# Patient Record
Sex: Female | Born: 1980 | Race: Black or African American | Hispanic: No | Marital: Single | Smoking: Current every day smoker
Health system: Southern US, Community
[De-identification: ages and names within clinical notes are randomized; demographics above are authoritative.]

## PROBLEM LIST (undated history)

## (undated) DIAGNOSIS — Z862 Personal history of diseases of the blood and blood-forming organs and certain disorders involving the immune mechanism: Secondary | ICD-10-CM

## (undated) DIAGNOSIS — Z872 Personal history of diseases of the skin and subcutaneous tissue: Secondary | ICD-10-CM

## (undated) DIAGNOSIS — L732 Hidradenitis suppurativa: Secondary | ICD-10-CM

## (undated) HISTORY — DX: Hidradenitis suppurativa: L73.2

## (undated) HISTORY — DX: Personal history of diseases of the blood and blood-forming organs and certain disorders involving the immune mechanism: Z86.2

---

## 1999-10-21 ENCOUNTER — Emergency Department (HOSPITAL_COMMUNITY): Admission: EM | Admit: 1999-10-21 | Discharge: 1999-10-21 | Payer: Self-pay | Admitting: Emergency Medicine

## 2001-04-06 ENCOUNTER — Other Ambulatory Visit: Admission: RE | Admit: 2001-04-06 | Discharge: 2001-04-06 | Payer: Self-pay | Admitting: Obstetrics and Gynecology

## 2001-07-20 ENCOUNTER — Encounter (INDEPENDENT_AMBULATORY_CARE_PROVIDER_SITE_OTHER): Payer: Self-pay | Admitting: Specialist

## 2001-07-20 ENCOUNTER — Inpatient Hospital Stay (HOSPITAL_COMMUNITY): Admission: AD | Admit: 2001-07-20 | Discharge: 2001-07-26 | Payer: Self-pay | Admitting: *Deleted

## 2001-07-21 ENCOUNTER — Encounter: Payer: Self-pay | Admitting: Obstetrics & Gynecology

## 2001-10-28 ENCOUNTER — Emergency Department (HOSPITAL_COMMUNITY): Admission: EM | Admit: 2001-10-28 | Discharge: 2001-10-29 | Payer: Self-pay | Admitting: Emergency Medicine

## 2001-10-29 ENCOUNTER — Encounter: Payer: Self-pay | Admitting: Emergency Medicine

## 2003-09-12 ENCOUNTER — Emergency Department (HOSPITAL_COMMUNITY): Admission: EM | Admit: 2003-09-12 | Discharge: 2003-09-12 | Payer: Self-pay | Admitting: Emergency Medicine

## 2005-04-08 ENCOUNTER — Emergency Department (HOSPITAL_COMMUNITY): Admission: EM | Admit: 2005-04-08 | Discharge: 2005-04-08 | Payer: Self-pay | Admitting: Emergency Medicine

## 2010-06-07 ENCOUNTER — Emergency Department (HOSPITAL_COMMUNITY): Admission: EM | Admit: 2010-06-07 | Discharge: 2010-06-07 | Payer: Self-pay | Admitting: Emergency Medicine

## 2010-12-27 NOTE — Discharge Summary (Signed)
[facility]  Patient:    Nichole Robinson, Nichole Robinson Visit Number: [id number] MRN: [id number]          Service Type: OBS Location: 910A 9117 [id number] Attending Physician:  Mickle Mallory Dictated by:   Caralyn Guile Arlyce Dice, M.D. Admit Date:  07/20/2001 Discharge Date: 07/26/2001                             Discharge Summary  FINAL DIAGNOSIS:              Preterm labor, breech presentation.  SECONDARY DIAGNOSES:          None.  PROCEDURE:                    Primary low transverse cesarean section.  CONDITION ON DISCHARGE:       Improved.  HISTORY:                      This is a 30 year old, Gravida 3, Para 0, AB 2 who presented with preterm labor at [redacted] weeks gestation.  Cervix was dilated to 3 cm.  HOSPITAL COURSE:              The patient was admitted to the hospital on July 22, 2001 she was placed on magnesium, given betamethasone and placed at bedrest.  She was followed on this regimen until July 23, 2001 when she was noted to be having more contractions.  At 7 p.m. on July 23, 2001 she was evaluated by Dr. Aldona Bar where the patient was found to be in breech presentation and a foot was palpable in the cervix.  Decision was made at that point to proceed with cesarean section for delivery.  She was brought to the operating room by Dr. Annamaria Helling on July 23, 2001 at 8 p.m. where a 2 pound 9 ounce female infant, Apgars score 6 and 7 was delivered.  The patients postpartum course was benign without significant fever or anemia. On the third postoperative day, the patient was felt to be ready for discharge.  Her hemoglobin was actually quite low at 6.6 gm.  Her white count was 8900. She was discharged on a regular diet and told to limit her activity.  She was given prenatal vitamins, ferrous sulfate, Tylox and over the counter pain medication to take.  She is also asked to follow up in the office in four to six weeks.  LABORATORY DATA:               Revealed an admission hemoglobin of 9.8. Discharge hemoglobin was 6.6.  Her blood chemistries were all within normal limits.  A urinalysis was positive for leukocyte esterase, negative for nitrates and less than 3 WBCs per high powered field.  Her Group B Strep was positive.  RPR was nonreactive. Dictated by:   Caralyn Guile Arlyce Dice, M.D. Attending Physician:  Mickle Mallory DD:  08/17/01 TD:  08/17/01 Job: [id number]

## 2010-12-27 NOTE — Op Note (Signed)
[facility]  Patient:    Nichole Robinson, Nichole Robinson Visit Number: [id number] MRN: [id number]          Service Type: OBS Location: 910B 9164 01 Attending Physician:  Mickle Mallory Dictated by:   Gerrit Friends. Aldona Bar, M.D. Proc. Date: 07/23/01 Admit Date:  07/20/2001                             Operative Report  PREOPERATIVE DIAGNOSES:       A 28 week pregnancy, preterm labor, double footling breech presentation, positive group B Strep.  POSTOPERATIVE DIAGNOSES:      A 28 week pregnancy, preterm labor, double footling breech presentation, positive group B Strep, delivery of viable female infant with Apgars 6 and 7, weight to be determined.  PROCEDURE:                    Primary low transverse cesarean section.  SURGEON:                      Gerrit Friends. Aldona Bar, M.D.  ANESTHESIA:                   Failed spinal, general endotracheal by Dr. Harvest Forest.  HISTORY:                      This 30 year old gravida 3, para 0 was admitted at [redacted] weeks gestation with preterm labor and a cervix dilated 3 cm.  She was begun on Unasyn, magnesium sulfate, given betamethasone, and put at bed rest. She did relatively well.  On the evening of December 13 she noted feeling pressure vaginally and gentle examination revealed one foot protruding through the cervix.  Membranes were still intact.  On ultrasound she was still obviously breech.  Very minimal cervix could be felt.  She was taken to the operating room for a STAT cesarean section at this time.  PROCEDURE:                    Patient was taken to the operating room and a spinal anesthetic was attempted, but unsuccessful.  Thereafter, she was prepped and draped in the usual fashion having been placed in the supine position slight tilt to the left with a Foley catheter inserted and then underwent general endotracheal anesthesia/intubation, etcetera.  Once the patient was well anesthetized the procedure was begun.   A Pfannenstiel incision was made with minimal difficulty, dissected down sharply to and through the fascia which was incised in a low transverse fashion as well.  Subfascial space was created inferiorly and superiorly.  Muscles separated in the midline.  Peritoneum identified and entered appropriately. Care taken to avoid the bowel superiorly and the bladder inferiorly. Vesicouterine peritoneum was identified, opened transversely, and pushed off the lower uterine segment with ease.  Sharp incision of the uterus in the low transverse fashion was then carried out with the Metzenbaum scissors and extended laterally.  At this time the placenta was encountered.  Nonetheless, it was very easy to grab both feet and deliver the baby as a double footling breech extraction using modified lovesett maneuver.  Once the infant was delivered the cord was clamped and cut and the infant was passed off to the awaiting team.  Apgars were assigned at 6 and 7.  The infant was taken to the newborn intensive care unit.  The infant was female.  Weight to be determined.  At this time the cord bloods were collected.  The placenta was delivered intact and the placenta was sent to pathology.  The uterus was then exteriorized and manually inspected and rendered free of any remaining products of conception.  Closure of the uterus incision was then carried out using #1 Vicryl in a running locking fashion.  Good uterine contractility was afforded with slowly given intravenous Pitocin and manual stimulation.  Sever figure-of-eight #1 Vicryl sutures were applied to the uterine incision for additional hemostasis.  Both tubes and ovaries appeared normal as did the uterus itself.  At this time with the uterine incision dry, the cul-de-sac was rendered hemostatic, all free blood and clot were removed, and the uterus was replaced into the abdomen.  Closure of the abdomen at this time was carried out in layers after a careful  count revealed that all counts were correct and no foreign bodies were noted to be remaining in the abdominal cavity.  The abdomen was irrigated profusely, the incision reinspected and noted to be dry. At this time closure of the anterior peritoneum was carried out using 0 Vicryl in a running fashion.  Muscles secured with same.  Assured of good subfascial hemostasis, the fascia was then reapproximated using 0 Vicryl from angle to midline bilaterally.  Subcutaneous tissue was then rendered hemostatic and staples were then used to close the skin.  A sterile pressure dressing was applied and the patient at this time was transported to the recovery room in satisfactory condition having tolerated procedure well.  Estimated blood loss 500 cc.  All counts correct x 2.  In summary, this gravida 3, para 0 was admitted at 28 weeks approximately 72 hours ago in preterm labor where the cervix was at least 3 cm dilated.  She was given Unasyn, magnesium sulfate, and betamethasone, and was stable for approximately 72 hours.  On the evening of December 13 she was noted to have a bulging bag in the vagina and was still breech and one foot of the fetus was protruding through the cervical os.  She was taken to the operating room for cesarean section for delivery.  At the conclusion of the procedure both mother and baby were doing well in their respective recovery areas. Dictated by:   Gerrit Friends. Aldona Bar, M.D. Attending Physician:  Mickle Mallory DD:  07/23/01 TD:  07/23/01 Job: [id number]

## 2011-05-25 ENCOUNTER — Emergency Department (HOSPITAL_COMMUNITY)
Admission: EM | Admit: 2011-05-25 | Discharge: 2011-05-25 | Disposition: A | Discharge status: Home / Self Care | Payer: Self-pay | Attending: Emergency Medicine | Admitting: Emergency Medicine

## 2011-05-25 ENCOUNTER — Emergency Department (HOSPITAL_COMMUNITY): Payer: Self-pay

## 2011-05-25 DIAGNOSIS — M25569 Pain in unspecified knee: Secondary | ICD-10-CM | POA: Insufficient documentation

## 2011-05-25 DIAGNOSIS — M7989 Other specified soft tissue disorders: Secondary | ICD-10-CM | POA: Insufficient documentation

## 2012-08-21 ENCOUNTER — Emergency Department (HOSPITAL_COMMUNITY)
Admission: EM | Admit: 2012-08-21 | Discharge: 2012-08-21 | Disposition: A | Discharge status: Home / Self Care | Payer: Medicaid Other | Source: Home / Self Care

## 2012-08-21 ENCOUNTER — Encounter (HOSPITAL_COMMUNITY): Payer: Self-pay | Admitting: Emergency Medicine

## 2012-08-21 DIAGNOSIS — S93409A Sprain of unspecified ligament of unspecified ankle, initial encounter: Secondary | ICD-10-CM

## 2012-08-21 DIAGNOSIS — L700 Acne vulgaris: Secondary | ICD-10-CM

## 2012-08-21 DIAGNOSIS — L708 Other acne: Secondary | ICD-10-CM

## 2012-08-21 DIAGNOSIS — S96919A Strain of unspecified muscle and tendon at ankle and foot level, unspecified foot, initial encounter: Secondary | ICD-10-CM

## 2012-08-21 MED ORDER — TETRACYCLINE HCL 250 MG PO CAPS: 250.0000 mg | ORAL_CAPSULE | Freq: Four times a day (QID) | ORAL | Status: DC

## 2012-08-21 MED ORDER — CLINDAMYCIN PHOS-BENZOYL PEROX 1-5 % EX GEL: Freq: Two times a day (BID) | CUTANEOUS | Status: AC

## 2012-08-21 NOTE — ED Provider Notes (Signed)
History     CSN: [id number]  Arrival date & time 08/21/12  1145   None     Chief Complaint  Patient presents with  . Ankle Pain  . Rash    (Consider location/radiation/quality/duration/timing/severity/associated sxs/prior treatment) HPI Comments: 32 year old female presents with 2 complaints. 1. Complains of left ankle discomfort for approximately one week. She denies any history of trauma or known injury. She states there is swelling and tenderness primarily to the medial aspect that gets worse at the end of the day and at night. Occasionally feels numbness in the area. Denies injury, pain or discomfort to the foot. She is able to bear weight and ambulate.  2. complaints of facial acne for several years. She has been treated with antibiotics and topical applications as well. In the past several days she has developed pustular lesions on her face. She does not have any medications at this time for treatment. She denies fever or other constitutional symptoms.   History reviewed. No pertinent past medical history.  Past Surgical History  Procedure Date  . Cesarean section     No family history on file.  History  Substance Use Topics  . Smoking status: Current Every Day Smoker -- 0.5 packs/day    Types: Cigarettes  . Smokeless tobacco: Not on file  . Alcohol Use: Yes    OB History    Grav Para Term Preterm Abortions TAB SAB Ect Mult Living                  Review of Systems  Constitutional: Negative.   HENT: Negative.   Respiratory: Negative.   Cardiovascular: Negative.   Musculoskeletal:       As per history of present illness  Skin:       As per history of present illness  All other systems reviewed and are negative.    Allergies  Review of patient's allergies indicates no known allergies.  Home Medications   Current Outpatient Rx  Name  Route  Sig  Dispense  Refill  . MINOCYCLINE HCL PO   Oral   Take by mouth.         Marland Kitchen CLINDAMYCIN PHOS-BENZOYL  PEROX 1-5 % EX GEL   Topical   Apply topically 2 (two) times daily. Till symptoms resolve   25 g   2   . TETRACYCLINE HCL 250 MG PO CAPS   Oral   Take 1 capsule (250 mg total) by mouth 4 (four) times daily.   84 capsule   0     BP 133/89  Pulse 72  Temp 98.7 F (37.1 C) (Oral)  Resp 18  SpO2 100%  LMP 08/07/2012  Physical Exam  Constitutional: She is oriented to person, place, and time. She appears well-developed and well-nourished. No distress.  HENT:  Head: Normocephalic and atraumatic.  Eyes: EOM are normal. Pupils are equal, round, and reactive to light.  Neck: Normal range of motion. Neck supple.  Pulmonary/Chest: Effort normal.  Lymphadenopathy:    She has no cervical adenopathy.  Neurological: She is alert and oriented to person, place, and time. No cranial nerve deficit.  Skin: Skin is warm and dry.       There are several blackheads and small, Doan's to her face. She points to larger pustular lesions on the chin and right side of the nose. They are mildly red and tender. No signs of cellulitis.    ED Course  Procedures (including critical care time)  Labs Reviewed -  No data to display No results found.   1. Ankle strain   2. Pustular acne       MDM  For the pustular acne Benzaclin apply topically twice a day Tetracycline 250 mg 4 times a day for 3 weeks Apply a Coban wrap to the ankle and wear for approximately one week. Limit weightbearing and walking for this same period of time. Elevate. If no improvement or if getting worse in a week may need to have it x-rayed.        Hayden Rasmussen, NP 08/21/12 1400

## 2012-08-21 NOTE — ED Provider Notes (Signed)
Medical screening examination/treatment/procedure(s) were performed by non-physician practitioner and as supervising physician I was immediately available for consultation/collaboration.  Raynald Blend, MD 08/21/12 1525

## 2012-08-21 NOTE — ED Notes (Addendum)
Pt c/o left ankle pain x1 week.  States that the pain is worse at night and has noticed swelling and numbness Pain is constant and gradually getting worse Denies: inj/trauma to site  Also c/o rash/bumps on face x1 week Sx include: drainage, itching, and painful Denies: fevers, vomiting, nauseas, diarrhea  She is alert w/no signs of acute distress.

## 2012-08-22 ENCOUNTER — Telehealth (HOSPITAL_COMMUNITY): Payer: Self-pay | Admitting: Emergency Medicine

## 2012-08-22 NOTE — ED Notes (Signed)
Patient called today requesting pain medication for ankle;  Chart reviewed by Dr Lorenz Coaster.  RX for tramadol 50 mg 1-2 every 8 hours as needed dispense quantity #30

## 2012-10-27 ENCOUNTER — Telehealth (HOSPITAL_COMMUNITY): Payer: Self-pay | Admitting: *Deleted

## 2012-10-27 NOTE — ED Notes (Signed)
Pt. called on VM and said the pharmacy told her they did not have any refills of her Benzaclin and she would have to call us.  She insisted to them that she did have 2 refills.  I accessed pt.'s chart and saw that she did have 2 refills.  I called the [facility] on [address] I told them that pt. does have 2 refills of this medication. She said she could see the 2 refills. I told her that someone there told the pt. she did not have any refills.  She told me they did not have it in stock. They will order it tomorrow and it should be in on Friday. I called pt. and told her this. Vassie Moselle 10/27/2012

## 2012-12-15 IMAGING — CR DG KNEE COMPLETE 4+V*L*
5 series · 5 of 5 positions shown · non-contrast
Comparison: None.

CLINICAL DATA: Left anterior knee pain and swelling

LEFT KNEE - COMPLETE 4+ VIEW

[t knee ap left]
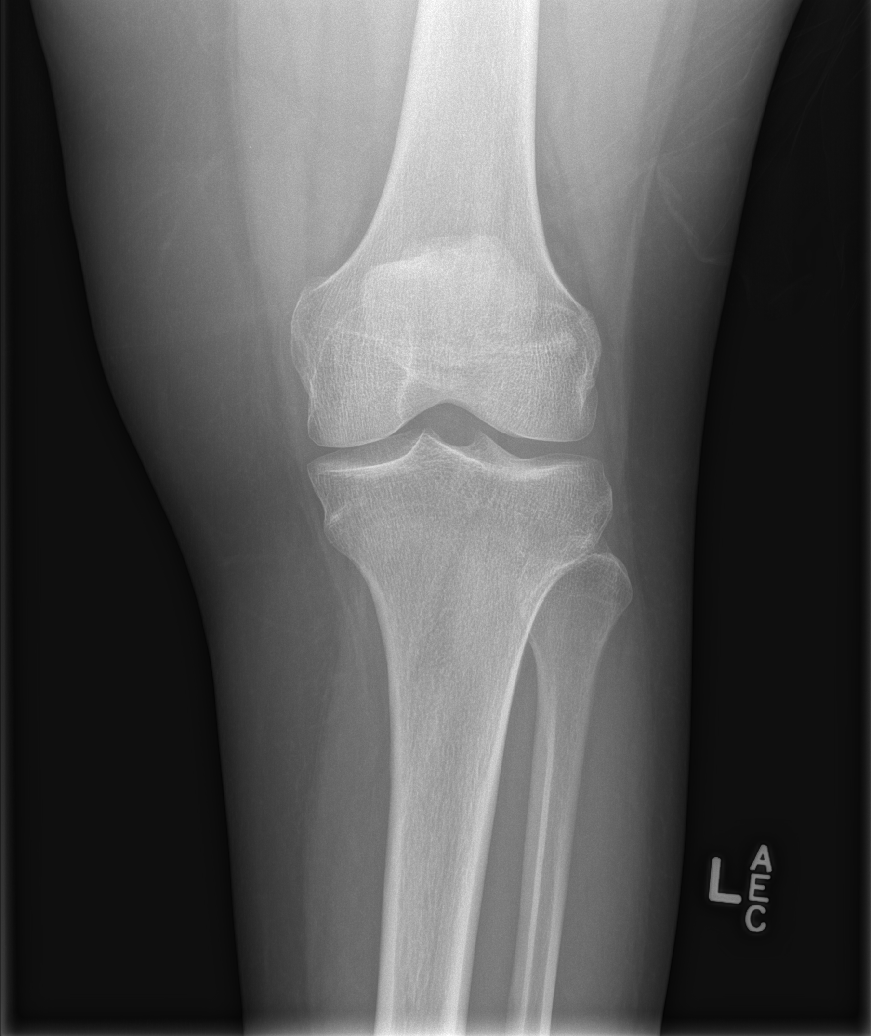

[t knee obl left (1 of 2)]
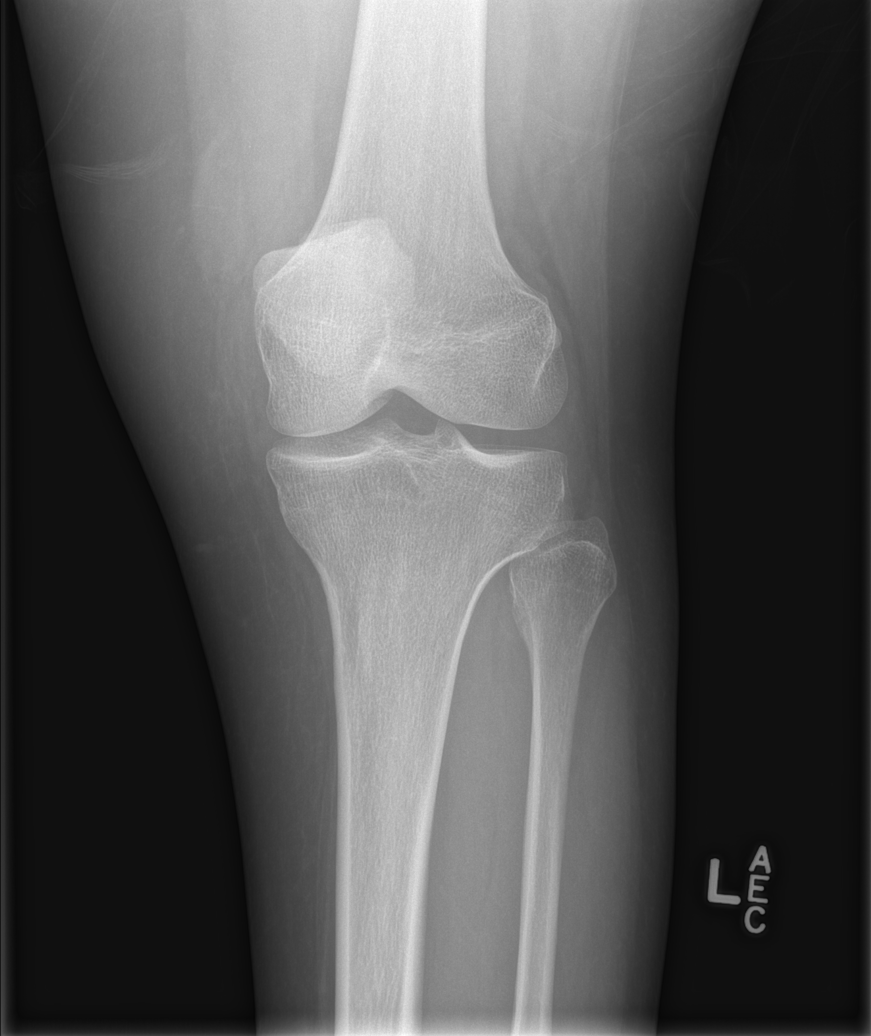

[t knee obl left (2 of 2)]
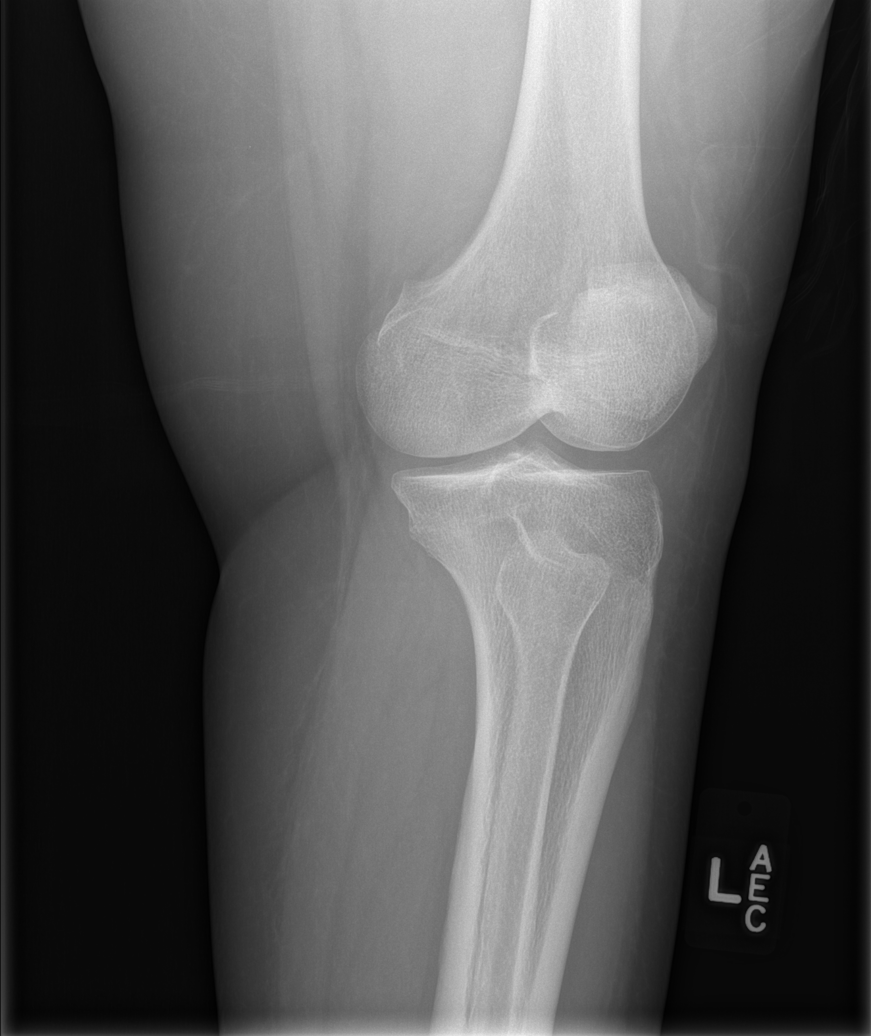

[t knee lat left (1 of 2)]
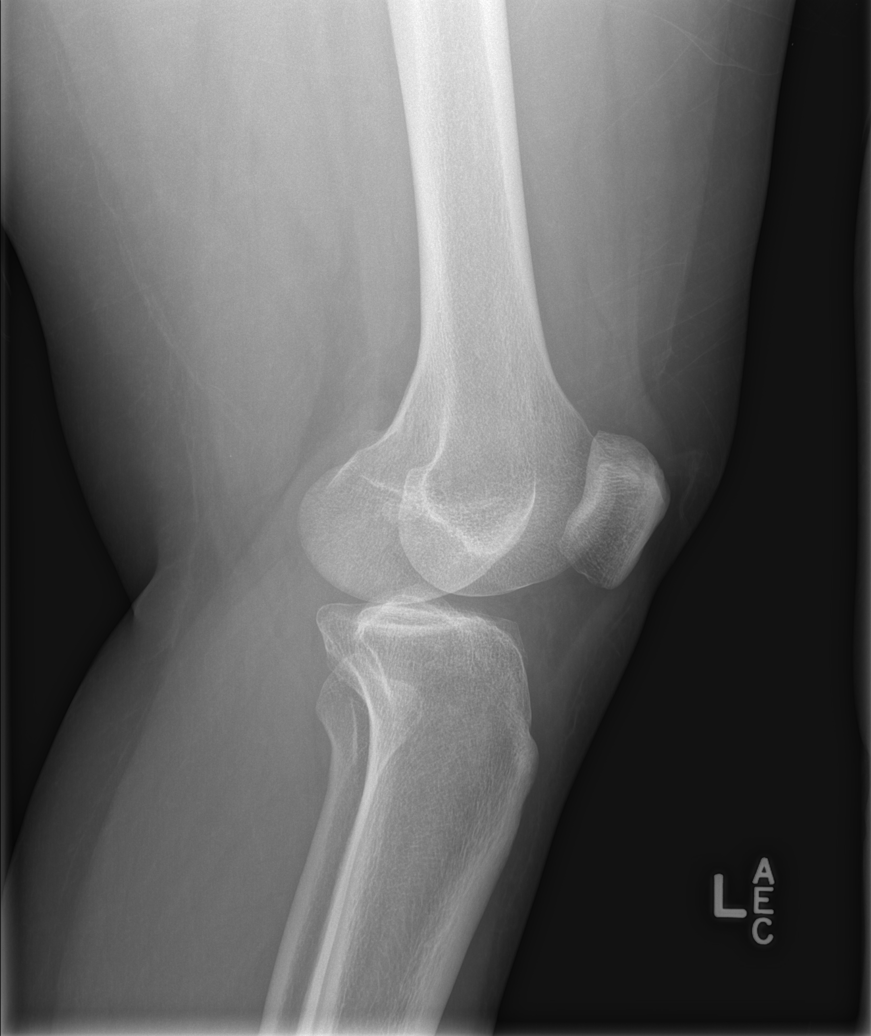

[t knee lat left (2 of 2)]
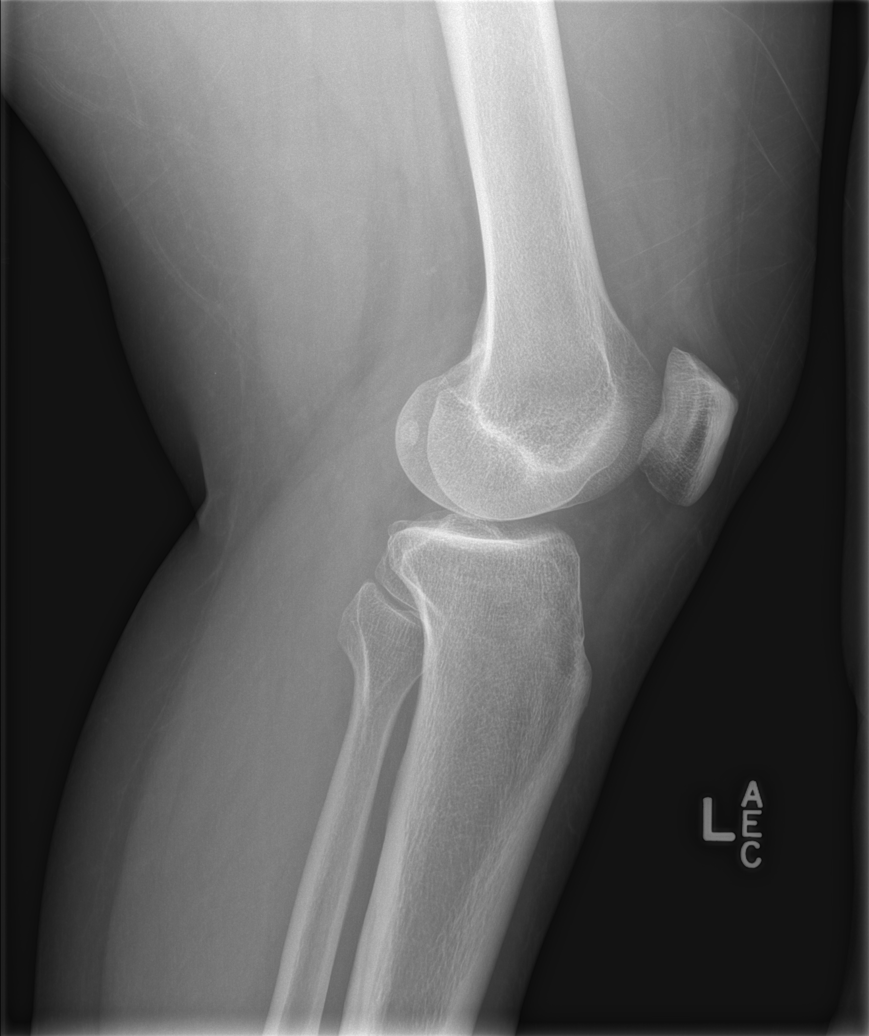

[5 of 5 positions shown; findings below may reference images not displayed]

FINDINGS: No fracture or dislocation.  Joint spaces are preserved.
No joint effusion.  No evidence of chondrocalcinosis.  Regional
soft tissues are normal. No radiopaque foreign body.
IMPRESSION: Normal radiographs of the left knee.

## 2014-08-07 ENCOUNTER — Emergency Department (HOSPITAL_COMMUNITY)
Admission: EM | Admit: 2014-08-07 | Discharge: 2014-08-07 | Disposition: A | Discharge status: Home / Self Care | Payer: Self-pay | Source: Home / Self Care | Attending: Family Medicine | Admitting: Family Medicine

## 2014-08-07 ENCOUNTER — Encounter (HOSPITAL_COMMUNITY): Payer: Self-pay | Admitting: Emergency Medicine

## 2014-08-07 DIAGNOSIS — L0201 Cutaneous abscess of face: Secondary | ICD-10-CM

## 2014-08-07 HISTORY — DX: Personal history of diseases of the skin and subcutaneous tissue: Z87.2

## 2014-08-07 MED ORDER — LIDOCAINE-EPINEPHRINE (PF) 2 %-1:200000 IJ SOLN
INTRAMUSCULAR | Status: AC
  Filled 2014-08-07: qty 20

## 2014-08-07 MED ORDER — DOXYCYCLINE HYCLATE 100 MG PO CAPS: 100.0000 mg | ORAL_CAPSULE | Freq: Two times a day (BID) | ORAL | Status: AC

## 2014-08-07 NOTE — ED Notes (Signed)
Pt developed a bump on the left side of her face on Thursday.  She states it opened on it's own and drained, but the bump is coming back and another one is developing near it.

## 2014-08-07 NOTE — Discharge Instructions (Signed)
Thank you for coming in today.' Take doxycycline twice daily. Use birth control taking this medication.   Abscess Care After An abscess (also called a boil or furuncle) is an infected area that contains a collection of pus. Signs and symptoms of an abscess include pain, tenderness, redness, or hardness, or you may feel a moveable soft area under your skin. An abscess can occur anywhere in the body. The infection may spread to surrounding tissues causing cellulitis. A cut (incision) by the surgeon was made over your abscess and the pus was drained out. Gauze may have been packed into the space to provide a drain that will allow the cavity to heal from the inside outwards. The boil may be painful for 5 to 7 days. Most people with a boil do not have high fevers. Your abscess, if seen early, may not have localized, and may not have been lanced. If not, another appointment may be required for this if it does not get better on its own or with medications. HOME CARE INSTRUCTIONS   Only take over-the-counter or prescription medicines for pain, discomfort, or fever as directed by your caregiver.  When you bathe, soak and then remove gauze or iodoform packs at least daily or as directed by your caregiver. You may then wash the wound gently with mild soapy water. Repack with gauze or do as your caregiver directs. SEEK IMMEDIATE MEDICAL CARE IF:   You develop increased pain, swelling, redness, drainage, or bleeding in the wound site.  You develop signs of generalized infection including muscle aches, chills, fever, or a general ill feeling.  An oral temperature above 102 F (38.9 C) develops, not controlled by medication. See your caregiver for a recheck if you develop any of the symptoms described above. If medications (antibiotics) were prescribed, take them as directed. Document Released: 02/13/2005 Document Revised: 10/20/2011 Document Reviewed: 10/11/2007 [facility] Patient Information 2015 [employer] [place],  [place]. This information is not intended to replace advice given to you by your health care provider. Make sure you discuss any questions you have with your health care provider.

## 2014-08-07 NOTE — ED Provider Notes (Signed)
Nichole Robinson is a 33 y.o. female who presents to Urgent Care today for abscess. Patient has a history of cystic acne. She developed an abscess in the left side of her face about a week ago. She is able to express pus and drained on its own about a week ago. The abscess has returned. She denies any fevers or chills vomiting or diarrhea. She takes Bactrim daily for acne treatment. No fevers or chills.   Past Medical History  Diagnosis Date  . History of cystic acne    Past Surgical History  Procedure Laterality Date  . Cesarean section     History  Substance Use Topics  . Smoking status: Current Every Day Smoker -- 1.00 packs/day    Types: Cigarettes  . Smokeless tobacco: Never Used  . Alcohol Use: Yes   ROS as above Medications: No current facility-administered medications for this encounter.   Current Outpatient Prescriptions  Medication Sig Dispense Refill  . clindamycin-benzoyl peroxide (BENZACLIN) gel Apply topically 2 (two) times daily. Till symptoms resolve 25 g 2  . doxycycline (VIBRAMYCIN) 100 MG capsule Take 1 capsule (100 mg total) by mouth 2 (two) times daily. 20 capsule 0   No Known Allergies   Exam:  BP 122/79 mmHg  Pulse 75  Temp(Src) 98.5 F (36.9 C) (Oral)  Resp 12  SpO2 95%  LMP 07/20/2014 (Exact Date) Gen: Well NAD Skin: Small fluctuant tender abscess left face. No fevers or chills. No pus expressible.  Abscess incision and drainage: And sent obtained and timeout performed. Area cleaned with alcohol. 1 mL of lidocaine with epinephrine injected achieving good anesthesia. Sharp incision was made to the area of fluctuance and pus drained and cultured. Patient tolerated the procedure well.  No results found for this or any previous visit (from the past 24 hour(s)). No results found.  Assessment and Plan: 33 y.o. female with facial abscess secondary to cystic acne. Patient is already taking Bactrim. Treat with doxycycline. Culture pending. Follow-up  as needed.  Discussed warning signs or symptoms. Please see discharge instructions. Patient expresses understanding.     Rodolph BongEvan S Corey, MD 08/07/14 [phone]

## 2014-08-10 LAB — CULTURE, ROUTINE-ABSCESS
Culture: NO GROWTH
Gram Stain: NONE SEEN
SPECIAL REQUESTS: NORMAL

## 2014-10-19 ENCOUNTER — Other Ambulatory Visit: Payer: Self-pay | Admitting: Family

## 2014-10-19 DIAGNOSIS — R609 Edema, unspecified: Secondary | ICD-10-CM

## 2014-10-23 ENCOUNTER — Ambulatory Visit
Admission: RE | Admit: 2014-10-23 | Discharge: 2014-10-23 | Disposition: A | Discharge status: Home / Self Care | Payer: 59 | Source: Ambulatory Visit | Attending: Family | Admitting: Family

## 2014-10-23 DIAGNOSIS — R609 Edema, unspecified: Secondary | ICD-10-CM

## 2016-05-15 IMAGING — US US EXTREM  UP VENOUS*R*
1 series · 13 of 24 positions shown · non-contrast
Comparison: None.

CLINICAL DATA: 33-year-old female with right upper extremity
swelling



[Series 1: us extrem up venous*right* · 13 of 33 slices shown]
[im 1/33]
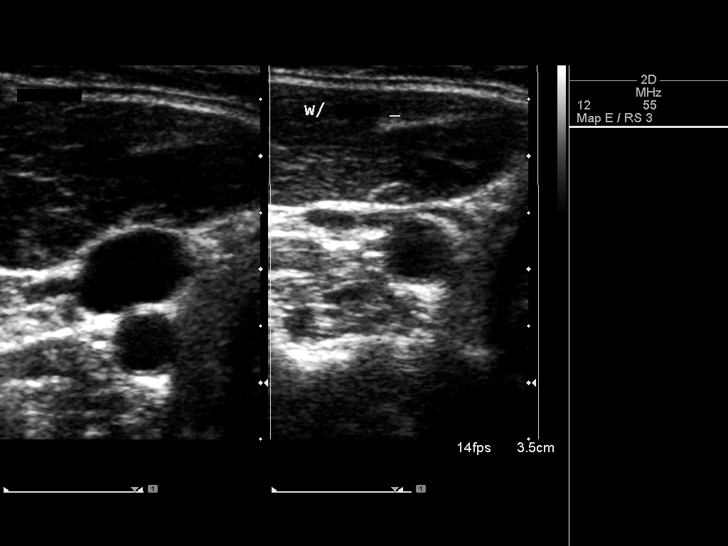
[im 3/33]
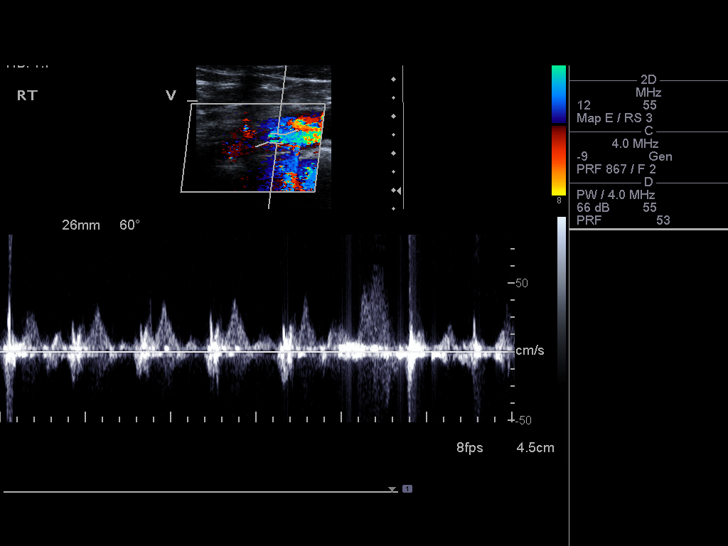
[im 6/33]
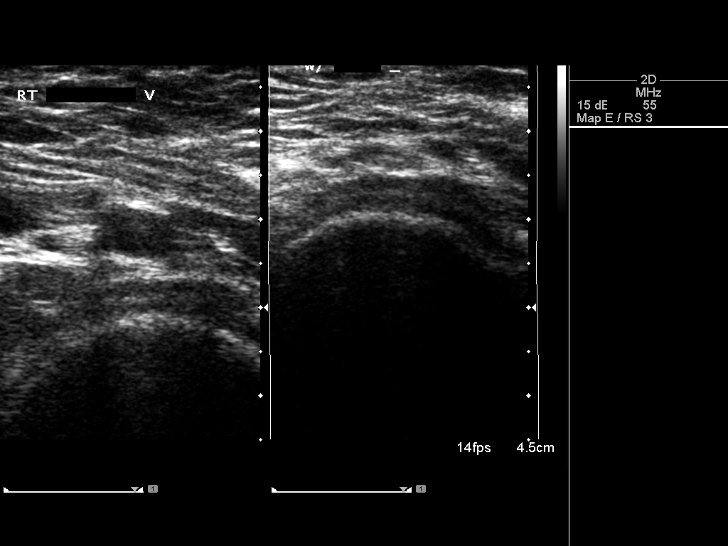
[im 9/33]
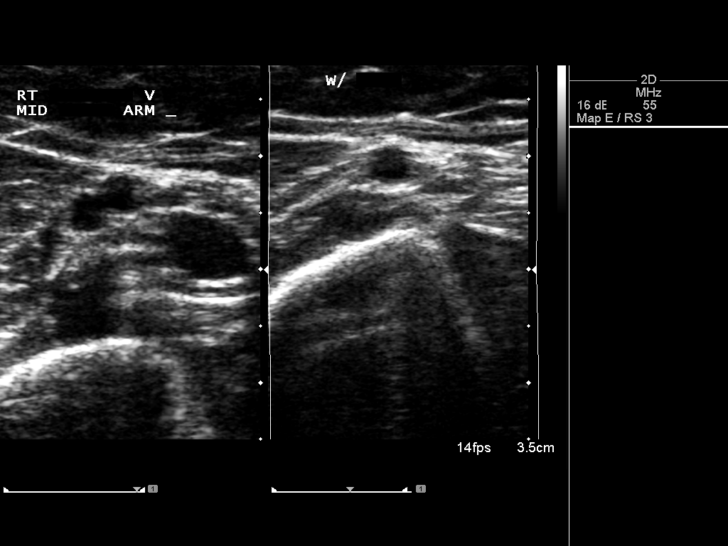
[im 12/33]
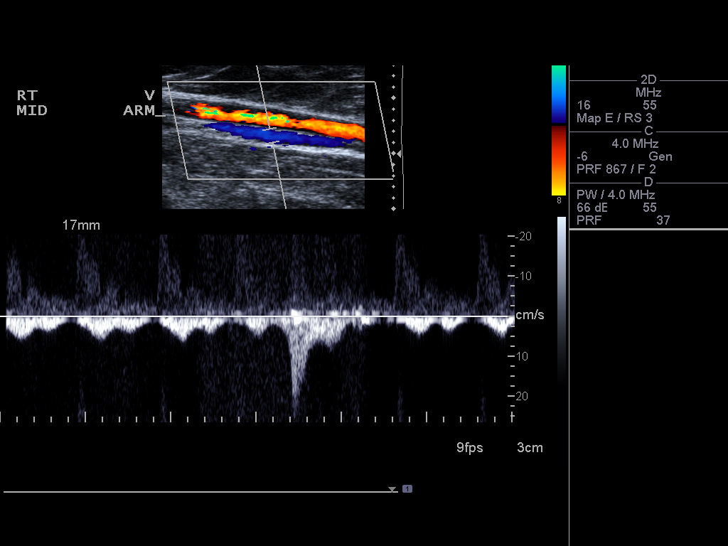
[im 14/33]
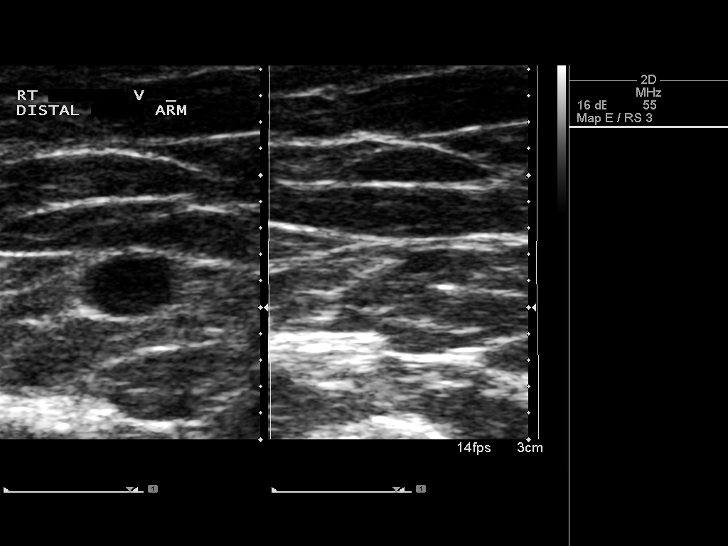
[im 17/33]
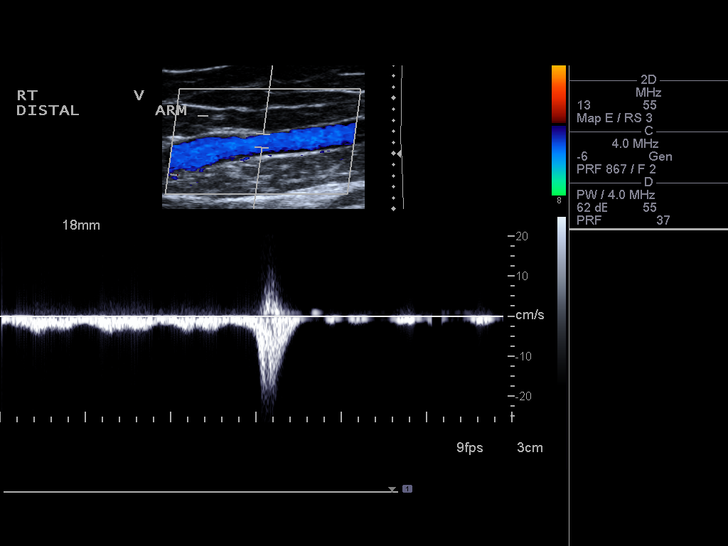
[im 19/33]
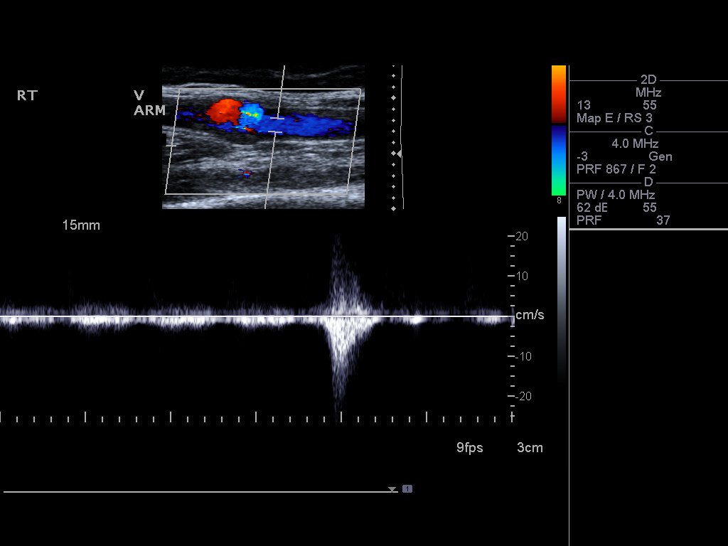
[im 21/33]
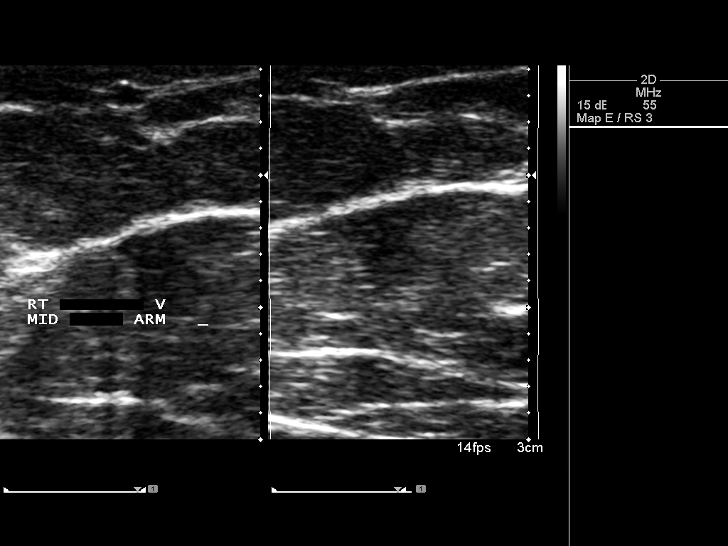
[im 24/33]
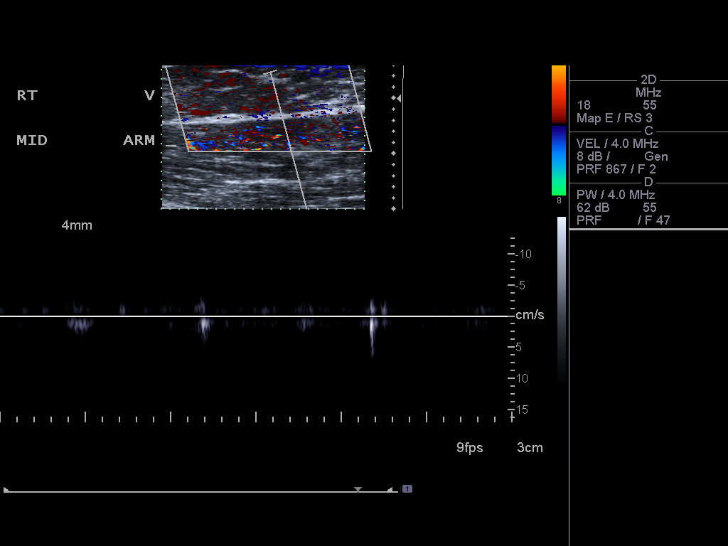
[im 27/33]
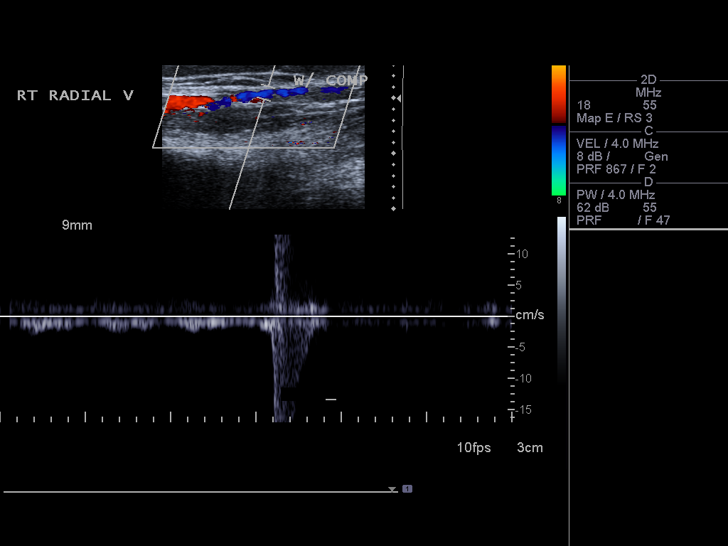
[im 30/33]
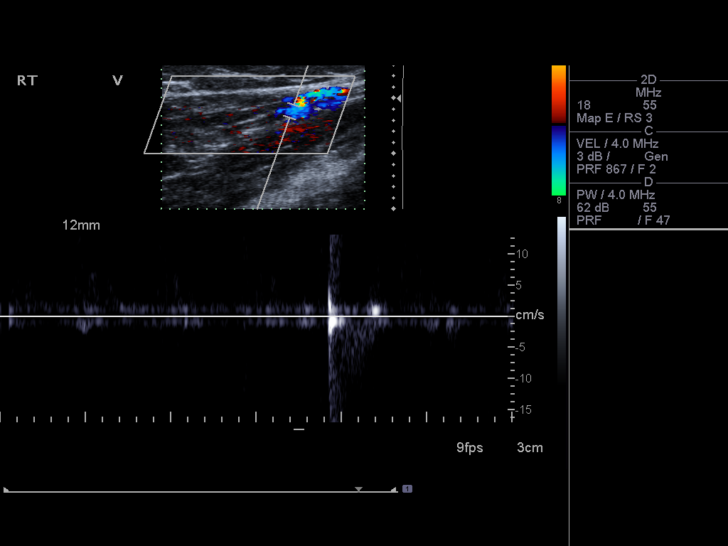
[im 33/33]
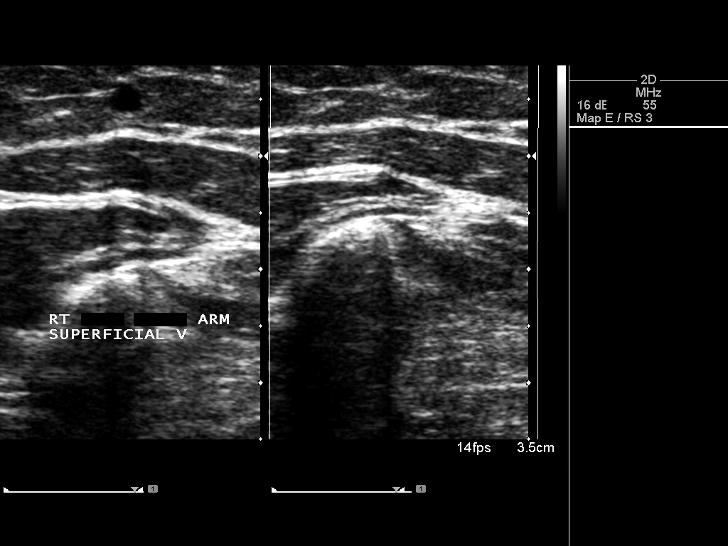

[13 of 24 positions shown; findings below may reference images not displayed]

FINDINGS: Contralateral Subclavian Vein: Respiratory phasicity is normal and
symmetric with the symptomatic side. No evidence of thrombus. Normal
compressibility.

Internal Jugular Vein: No evidence of thrombus. Normal
compressibility, respiratory phasicity and response to augmentation.

Subclavian Vein: No evidence of thrombus. Normal compressibility,
respiratory phasicity and response to augmentation.

Axillary Vein: No evidence of thrombus. Normal compressibility,
respiratory phasicity and response to augmentation.

Cephalic Vein: No evidence of thrombus. Normal compressibility,
respiratory phasicity and response to augmentation.

Basilic Vein: No evidence of thrombus. Normal compressibility,
respiratory phasicity and response to augmentation.

Brachial Veins: No evidence of thrombus. Normal compressibility,
respiratory phasicity and response to augmentation.

Radial Veins: No evidence of thrombus. Normal compressibility,
respiratory phasicity and response to augmentation.

Ulnar Veins: No evidence of thrombus. Normal compressibility,
respiratory phasicity and response to augmentation.

Venous Reflux:  None visualized.

Other Findings:  None visualized.
IMPRESSION: No evidence of deep venous thrombosis.

## 2016-12-15 ENCOUNTER — Other Ambulatory Visit (HOSPITAL_COMMUNITY)
Admission: RE | Admit: 2016-12-15 | Discharge: 2016-12-15 | Disposition: A | Discharge status: Home / Self Care | Payer: BC Managed Care – PPO | Source: Ambulatory Visit | Attending: Family Medicine | Admitting: Family Medicine

## 2016-12-15 ENCOUNTER — Other Ambulatory Visit: Payer: Self-pay | Admitting: Family Medicine

## 2016-12-15 DIAGNOSIS — Z01419 Encounter for gynecological examination (general) (routine) without abnormal findings: Secondary | ICD-10-CM | POA: Diagnosis present

## 2016-12-17 LAB — CYTOLOGY - PAP: Diagnosis: NEGATIVE

## 2017-06-12 ENCOUNTER — Other Ambulatory Visit: Payer: Self-pay | Admitting: Family Medicine

## 2017-06-12 DIAGNOSIS — R945 Abnormal results of liver function studies: Principal | ICD-10-CM

## 2017-06-12 DIAGNOSIS — R7989 Other specified abnormal findings of blood chemistry: Secondary | ICD-10-CM

## 2017-06-26 ENCOUNTER — Ambulatory Visit
Admission: RE | Admit: 2017-06-26 | Discharge: 2017-06-26 | Disposition: A | Discharge status: Home / Self Care | Payer: BC Managed Care – PPO | Source: Ambulatory Visit | Attending: Family Medicine | Admitting: Family Medicine

## 2017-06-26 ENCOUNTER — Other Ambulatory Visit: Payer: Self-pay

## 2017-06-26 ENCOUNTER — Emergency Department (HOSPITAL_COMMUNITY)
Admission: EM | Admit: 2017-06-26 | Discharge: 2017-06-26 | Disposition: A | Discharge status: Home / Self Care | Payer: BC Managed Care – PPO | Attending: Emergency Medicine | Admitting: Emergency Medicine

## 2017-06-26 ENCOUNTER — Encounter (HOSPITAL_COMMUNITY): Payer: Self-pay | Admitting: *Deleted

## 2017-06-26 DIAGNOSIS — L732 Hidradenitis suppurativa: Secondary | ICD-10-CM | POA: Diagnosis not present

## 2017-06-26 DIAGNOSIS — M542 Cervicalgia: Secondary | ICD-10-CM | POA: Insufficient documentation

## 2017-06-26 DIAGNOSIS — Z872 Personal history of diseases of the skin and subcutaneous tissue: Secondary | ICD-10-CM

## 2017-06-26 DIAGNOSIS — R945 Abnormal results of liver function studies: Principal | ICD-10-CM

## 2017-06-26 DIAGNOSIS — Z79899 Other long term (current) drug therapy: Secondary | ICD-10-CM | POA: Diagnosis not present

## 2017-06-26 DIAGNOSIS — R7989 Other specified abnormal findings of blood chemistry: Secondary | ICD-10-CM

## 2017-06-26 DIAGNOSIS — F17210 Nicotine dependence, cigarettes, uncomplicated: Secondary | ICD-10-CM | POA: Insufficient documentation

## 2017-06-26 DIAGNOSIS — M79601 Pain in right arm: Secondary | ICD-10-CM | POA: Insufficient documentation

## 2017-06-26 MED ORDER — NAPROXEN 500 MG PO TABS: 500.0000 mg | ORAL_TABLET | Freq: Two times a day (BID) | ORAL | 0 refills | Status: AC

## 2017-06-26 NOTE — Discharge Instructions (Signed)
He was seen in the ER for right shoulder pain that is most likely due to a rotator cuff tendinitis.  I would like for you to follow-up with the sports medicine center and make an appointment for further management.  I am prescribing you naproxen which I want you to take twice a day for 7 days.  If you develop any new or worsening symptoms he will need to be seen sooner.  For your cystic acne, I would recommend you follow-up with your dermatologist as there is already a plan of care in place.

## 2017-06-26 NOTE — ED Triage Notes (Signed)
Pt in c/o R under arm abcess, pt had US done today for elevated liver function,  Pt not here for eval for this at this time, pt here d/t inability to raise arm, denies injury to the area, pain onset x 2 wks, pt has cystic acne with L facial swelling, A&Ox4

## 2017-06-26 NOTE — ED Provider Notes (Signed)
[facility] Provider Note   CSN: [id number] Arrival date & time: 06/26/17  1120     History   Chief Complaint Chief Complaint  Patient presents with  . Arm Pain   HPI  Nichole Robinson is a 36 y.o. female presenting with right arm and shoulder pain which first began 2 weeks ago.  Pain does not radiate.  She denies shortness of breath.  She notes the pain is worse when she coughs.   She reports she has not lifted any heavy weight recently and denies injury to the arm.  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  She notes she sometimes has numbness and tingling in her fingers.  At home she has been taking ibuprofen and trying ice and heating pads for relief.  She has also tried range of motion exercises when in the shower with warm water. No fevers, chills, nausea, vomiting, abdominal pain.   Past Medical History:  Diagnosis Date  . History of cystic acne    There are no active problems to display for this patient.  Past Surgical History:  Procedure Laterality Date  . CESAREAN SECTION      OB History    No data available     Home Medications    Prior to Admission medications   Medication Sig Start Date End Date Taking? Authorizing Provider  clindamycin-benzoyl peroxide (BENZACLIN) gel Apply topically 2 (two) times daily. Till symptoms resolve 08/21/12   Hayden RasmussenMabe, David, NP  doxycycline (VIBRAMYCIN) 100 MG capsule Take 1 capsule (100 mg total) by mouth 2 (two) times daily. 08/07/14   Rodolph Bongorey, Evan S, MD  naproxen (NAPROSYN) 500 MG tablet Take 1 tablet (500 mg total) 2 (two) times daily with a meal by mouth. 06/26/17 06/26/18  Freddrick MarchAmin, Kourtnie Sachs, MD   Family History No family history on file.  Social History Social History   Tobacco Use  . Smoking status: Current Every Day Smoker    Packs/day: 1.00    Types: Cigarettes  . Smokeless tobacco: Never  Used  Substance Use Topics  . Alcohol use: Yes  . Drug use: No   Allergies   Patient has no known allergies.  Review of Systems Review of Systems  Constitutional: Negative for activity change, chills and fever.  HENT: Negative for rhinorrhea and sore throat.   Respiratory: Negative for cough, shortness of breath and wheezing.   Cardiovascular: Negative for palpitations.  Gastrointestinal: Negative for abdominal pain, diarrhea, nausea and vomiting.  Musculoskeletal: Positive for neck pain. Negative for joint swelling.  Skin: Negative for rash.   Physical Exam Updated Vital Signs BP (!) 147/80 (BP Location: Left Arm)   Pulse 61   Temp 98.6 F (37 C) (Oral)   Resp 16   LMP 06/24/2017 (Approximate)   SpO2 100%   Physical Exam [name], no acute distress Skin -warm, dry, cystic acne overlying chin and bilateral cheeks  HEENT -NCAT, EOMI, PERRLA, MMM, oropharynx is clear Neck -supple, mild tenderness with palpation of the trapezius musculature, range of motion is normal Chest -CTA B, normal work of breathing Heart - RRR no MRG Abdomen -soft, NT, ND, positive bowel sounds Musculoskeletal -  R arm: No atrophy or asymmetry present on inspection.  No swelling, erythema or ecchymosis overlying right arm or shoulder.  ROM is severely restricted secondary to pain.  TTP over deltoid.  No tenderness over  bicipital groove.  Pain with empty can test.   Neurovascularly intact. Neuro-alert, oriented x3, no focal deficits, she has 5 out of 5 bilaterally in upper and lower extremities, normal sensation Psych - overly anxious   ED Treatments / Results  Labs (all labs ordered are listed, but only abnormal results are displayed) Labs Reviewed - No data to display  EKG  EKG Interpretation None      Radiology [place] Abdomen Complete  Result Date: 06/26/2017 CLINICAL DATA:  Elevated LFTs EXAM: ABDOMEN ULTRASOUND COMPLETE COMPARISON:  None. FINDINGS: Gallbladder: Numerous  echogenic non mobile nonshadowing foci along the gallbladder wall compatible with gallbladder wall polyps. The largest is 7 mm. No wall thickening or sonographic Murphy's sign. No visible stones. Common bile duct: Diameter: Normal caliber, 2 mm. Liver: Mildly increased echotexture throughout the liver. 9 mm echogenic focus within the right hepatic lobe, likely small hemangioma. Portal vein is patent on color Doppler imaging with normal direction of blood flow towards the liver. IVC: No abnormality visualized. Pancreas: Visualized portion unremarkable. Spleen: Size and appearance within normal limits. Right Kidney: Length: 10.4 cm. Echogenicity within normal limits. No mass or hydronephrosis visualized. Left Kidney: Length: 10.5 cm. Echogenicity within normal limits. No mass or hydronephrosis visualized. Abdominal aorta: No aneurysm visualized. Other findings: None. IMPRESSION: Increased echotexture throughout the liver suggesting fatty infiltration. Small 9 mm hyperechoic area in the right hepatic lobe, likely hemangioma. Numerous gallbladder wall polyps, the largest 7 mm. No visible stones or evidence of acute cholecystitis. Electronically Signed   By: Charlett NoseKevin  Dover M.D.   On: 06/26/2017 12:01   Procedures Procedures (including critical care time)  Medications Ordered in ED Medications - No data to display  Initial Impression / Assessment and Plan / ED Course  I have reviewed the triage vital signs and the nursing notes.  Pertinent labs & imaging results that were available during my care of the patient were reviewed by me and considered in my medical decision making (see chart for details).  Patient is a 36 year old female presenting with right arm pain. No h/o recent falls or trauma to suspect acute fracture and physical exam without red flags.   Possible rotator cuff strain vs tendonitis based on limited physical exam today secondary to pain.  [facility]uation and [facility].   In regards to her cystic acne and hydradenitis suppurativa, patient has a plan of care in place with Dermatologist and is to follow up with them this week.   -Recommend Naproxen for pain control -Continue to use ice and heat to the area -She is to make appointment at [facility] for further eval  -Return precautions discussed  Final Clinical Impressions(s) / ED Diagnoses   Final diagnoses:  Right arm pain  H/O cystic acne  Hydradenitis   ED Discharge Orders        Ordered    naproxen (NAPROSYN) 500 MG tablet  2 times daily with meals     06/26/17 1506     Freddrick MarchYashika Darbie Biancardi, MD [facility], PGY-2     Freddrick MarchAmin, Jamile Rekowski, MD 06/26/17 1510    Blane OharaZavitz, Joshua, MD 06/30/17 [phone]

## 2022-05-09 NOTE — Progress Notes (Signed)
Office Visit Note  Patient: Nichole Robinson             Date of Birth: Apr 07, 1981           MRN: [id number]             PCP: Shanon Rosser, PA-C Referring: Shanon Rosser, PA-C Visit Date: 05/21/2022 Occupation: @GUAROCC @  Subjective:  History of joint swelling and positive ANA  History of Present Illness: Nichole Robinson is a 41 y.o. female seen in consultation per request of her PCP.  According the patient her symptoms a started with numbness in her hands when they are cold and joint swelling about 10 years ago.  Patient states that she has intermittent swelling in her elbows, knees, ankles and her feet.  She states she is asymptomatic when she is not having swelling.  She was seen by a rheumatologist several years ago who retired and she did not have follow-up with [place].  She does not recall given any treatment at the time.  She states 3 months ago she was seen by her PCP at the time she had some swelling in her knees.  She was advised to take Tylenol arthritis and had some lab work done.  Her ANA was positive and for that reason she was referred to me.  She feels some puffiness in her left knee today.  She denies any history of oral ulcers, nasal ulcers, sicca symptoms, photosensitivity, Raynaud's phenomenon or lymphadenopathy.  She was diagnosed with hidradenitis suppurativa 5 years ago.  She was under care of a dermatologist and was treated with antibiotics for hidradenitis suppurativa and acne.  She states now she is followed by her aestheticians and using topical agents.  She had a recent flare of hidradenitis suppurativa and was given doxycycline by her GYN.  There is no family history of autoimmune disease.  She is gravida 1, para 1.  No  history of DVTs.  Activities of Daily Living:  Patient reports morning stiffness for 0 minutes.   Patient Reports nocturnal pain.  Difficulty dressing/grooming: Denies Difficulty climbing stairs: Denies Difficulty getting out of chair: Denies Difficulty  using hands for taps, buttons, cutlery, and/or writing: Denies  Review of Systems  Constitutional:  Negative for fatigue.  HENT:  Negative for mouth sores and mouth dryness.   Eyes:  Negative for dryness.  Respiratory:  Negative for shortness of breath.   Cardiovascular:  Negative for chest pain and palpitations.  Gastrointestinal:  Negative for blood in stool, constipation and diarrhea.  Endocrine: Negative for increased urination.  Genitourinary:  Negative for involuntary urination.  Musculoskeletal:  Positive for joint pain, gait problem, joint pain, joint swelling, myalgias, muscle tenderness and myalgias. Negative for muscle weakness and morning stiffness.  Skin:  Positive for sensitivity to sunlight. Negative for color change, rash and hair loss.  Allergic/Immunologic: Negative for susceptible to infections.  Neurological:  Positive for numbness. Negative for dizziness and headaches.  Hematological:  Negative for swollen glands.  Psychiatric/Behavioral:  Negative for depressed mood and sleep disturbance. The patient is not nervous/anxious.     PMFS History:  Patient Active Problem List   Diagnosis Date Noted   Hidradenitis suppurativa 05/21/2022   History of anemia 05/21/2022   History of hypertension 05/21/2022   Neuropathy 05/21/2022    Past Medical History:  Diagnosis Date   Hidradenitis suppurativa    History of anemia    History of cystic acne     Family History  Problem Relation Age  of Onset   Healthy Mother    Healthy Father    Healthy Brother    Healthy Daughter    Past Surgical History:  Procedure Laterality Date   CESAREAN SECTION     Social History   Social History Narrative   Not on file    There is no immunization history on file for this patient.   Objective: Vital Signs: BP (!) 151/101 (BP Location: Right Arm, Patient Position: Sitting, Cuff Size: Normal)   Pulse 81   Resp 15   Ht 5' 2.5" (1.588 m)   Wt 205 lb 3.2 oz (93.1 kg)   BMI 36.93  kg/m    Physical Exam Vitals and nursing note reviewed.  Constitutional:      Appearance: She is well-developed.  HENT:     Head: Normocephalic and atraumatic.  Eyes:     Conjunctiva/sclera: Conjunctivae normal.  Cardiovascular:     Rate and Rhythm: Normal rate and regular rhythm.     Heart sounds: Normal heart sounds.  Pulmonary:     Effort: Pulmonary effort is normal.     Breath sounds: Normal breath sounds.  Abdominal:     General: Bowel sounds are normal.     Palpations: Abdomen is soft.  Musculoskeletal:     Cervical back: Normal range of motion.  Lymphadenopathy:     Cervical: No cervical adenopathy.  Skin:    General: Skin is warm and dry.     Capillary Refill: Capillary refill takes less than 2 seconds.  Neurological:     Mental Status: She is alert and oriented to person, place, and time.  Psychiatric:        Behavior: Behavior normal.      Musculoskeletal Exam: Cervical, thoracic, lumbar spine were in good range of motion.  She had no SI joint tenderness.  Shoulder joints, elbow joints, wrist joints, MCPs PIPs and DIPs been good range of motion with no synovitis.  Hip joints, knee joints, ankles, MTPs and PIPs been good range of motion with no synovitis.  She had tenderness on palpation over the lateral aspect of her left knee but no warmth swelling or effusion was noted.  She had bilateral pes planus.  CDAI Exam: CDAI Score: -- Patient Global: --; Provider Global: -- Swollen: --; Tender: -- Joint Exam 05/21/2022   No joint exam has been documented for this visit   There is currently no information documented on the homunculus. Go to the Rheumatology activity and complete the homunculus joint exam.  Investigation: No additional findings.  Imaging: XR Foot 2 Views Left  Result Date: 05/21/2022 PIP and DIP narrowing was noted.  No MTP, intertarsal, tibiotalar or subtalar joint space narrowing was noted.   No erosive changes were noted. Impression: These  findings are consistent with degenerative changes of the foot.  XR Foot 2 Views Right  Result Date: 05/21/2022 PIP and DIP narrowing was noted.  No MTP, intertarsal, tibiotalar or subtalar joint space narrowing was noted.  Posterior calcaneal spur was noted.  No erosive changes were noted. Impression: These findings are consistent with degenerative changes of the foot.  XR KNEE 3 VIEW LEFT  Result Date: 05/21/2022 No medial or lateral compartment narrowing was noted.  No patellofemoral narrowing was noted. Impression: Unremarkable x-rays of the knee joint.  XR KNEE 3 VIEW RIGHT  Result Date: 05/21/2022 No medial or lateral compartment narrowing was noted.  No patellofemoral narrowing was noted. Impression: Unremarkable x-rays of the knee joint.  XR Hand 2  View Left  Result Date: 05/21/2022 Mild PIP narrowing was noted.  No MCP, DIP, intercarpal or radiocarpal joint space narrowing was noted.  No erosive changes were noted. Impression: These findings are consistent with early degenerative changes of the hand.  XR Hand 2 View Right  Result Date: 05/21/2022 Mild PIP narrowing was noted.  No MCP, DIP, intercarpal or radiocarpal joint space narrowing was noted.  No erosive changes were noted. Impression: These findings are consistent with early degenerative changes of the hand.   Recent Labs: No results found for: "WBC", "HGB", "PLT", "NA", "K", "CL", "CO2", "GLUCOSE", "BUN", "CREATININE", "BILITOT", "ALKPHOS", "AST", "ALT", "PROT", "ALBUMIN", "CALCIUM", "GFRAA", "QFTBGOLD", "QFTBGOLDPLUS"  Speciality Comments: No specialty comments available.  Procedures:  No procedures performed Allergies: Patient has no known allergies.   Assessment / Plan:     Visit Diagnoses: Positive ANA (antinuclear antibody) - 10/23/21: ANA 1:80 NH, ESR 11, CRP 14.8, RF<14, anti-CCP<16 -patient gives history of intermittent joint swelling over the last 10 years.  She gives history of pain and swelling in her  elbows, hands, knees, ankles and her feet.  She states she had an episode about 3 months ago at the time she was seen by her PCP and was advised Tylenol arthritis.  She states she had no discomfort in between the episodes when she is not swollen.  She was seen by rheumatologist several years ago and no treatment was offered.  She also lost follow-up with the rheumatologist.  She denies any history of oral ulcers, nasal ulcers, malar rash, photosensitivity, Raynaud's phenomenon or lymphadenopathy.  Plan: CBC with Differential/Platelet, COMPLETE METABOLIC PANEL WITH GFR, Protein / creatinine ratio, urine, ANA, Anti-scleroderma antibody, RNP Antibody, Anti-Smith antibody, Sjogrens syndrome-A extractable nuclear antibody, Sjogrens syndrome-B extractable nuclear antibody, Anti-DNA antibody, double-stranded, C3 and C4, Beta-2 glycoprotein antibodies, Cardiolipin antibodies, IgG, IgM, IgA, Glucose 6 phosphate dehydrogenase  Pain in both hands -she gives history of pain in her both hands with intermittent swelling.  No synovitis was noted.  She states sometimes when her hands are cold the fingertips become numb.  Plan: XR Hand 2 View Right, XR Hand 2 View Left.  Mild PIP narrowing was noted bilaterally consistent with early degenerative changes.  Chronic pain of both knees -she gives history of intermittent pain and discomfort and swelling in her knee joints.  No warmth swelling or effusion was noted.  She had discomfort over the lateral aspect of her left knee.  Plan: Angiotensin converting enzyme, XR KNEE 3 VIEW RIGHT, XR KNEE 3 VIEW LEFT.  X the bilateral knee joints were unremarkable.  Pain in both feet -she could history of intermittent swelling in her feet.  No synovitis was noted.  Plan: XR Foot 2 Views Right, XR Foot 2 Views Left.  Early degenerative changes were noted in bilateral feet.  Hidradenitis suppurativa-patient gives history of hidradenitis suppurativa for the last 5 years.  She was under care of  her dermatologist.  She states her dermatologist moved.  She was recently given doxycycline by her GYN for a flare.  She would like to establish with a dermatologist.  We will place the referral for her.  Cystic acne-patient was treated by dermatologist in the past.  She does have scarring from cystic acne.  She has been seeing an aesthetician now.  History of hypertension-blood pressure was elevated today.  She is on amlodipine 2.5 mg p.o. daily.  Patient recently started the medication.  I advised her to monitor blood pressure closely and follow-up with the  PCP.  Neuropathy-she states she only has occasional numbness in the fingertips when her hands are cold.  History of anemia  Orders: Orders Placed This Encounter  Procedures   XR Hand 2 View Right   XR Hand 2 View Left   XR KNEE 3 VIEW RIGHT   XR KNEE 3 VIEW LEFT   XR Foot 2 Views Right   XR Foot 2 Views Left   CBC with Differential/Platelet   COMPLETE METABOLIC PANEL WITH GFR   Protein / creatinine ratio, urine   ANA   Anti-scleroderma antibody   RNP Antibody   Anti-Smith antibody   Sjogrens syndrome-A extractable nuclear antibody   Sjogrens syndrome-B extractable nuclear antibody   Anti-DNA antibody, double-stranded   C3 and C4   Beta-2 glycoprotein antibodies   Cardiolipin antibodies, IgG, IgM, IgA   Glucose 6 phosphate dehydrogenase   Angiotensin converting enzyme   Ambulatory referral to Dermatology   No orders of the defined types were placed in this encounter.    Follow-Up Instructions: Return for Positive ANA, joint pain.   Bo Merino, MD  Note - This record has been created using Editor, commissioning.  Chart creation errors have been sought, but may not always  have been located. Such creation errors do not reflect on  the standard of medical care.,

## 2022-05-21 ENCOUNTER — Encounter: Payer: Self-pay | Admitting: *Deleted

## 2022-05-21 ENCOUNTER — Ambulatory Visit (INDEPENDENT_AMBULATORY_CARE_PROVIDER_SITE_OTHER): Payer: BC Managed Care – PPO

## 2022-05-21 ENCOUNTER — Ambulatory Visit: Payer: BC Managed Care – PPO | Attending: Rheumatology | Admitting: Rheumatology

## 2022-05-21 ENCOUNTER — Encounter: Payer: Self-pay | Admitting: Rheumatology

## 2022-05-21 VITALS — BP 151/101 | HR 81 | Resp 15 | Ht 62.5 in | Wt 205.2 lb

## 2022-05-21 DIAGNOSIS — M79672 Pain in left foot: Secondary | ICD-10-CM

## 2022-05-21 DIAGNOSIS — R768 Other specified abnormal immunological findings in serum: Secondary | ICD-10-CM | POA: Diagnosis not present

## 2022-05-21 DIAGNOSIS — M25562 Pain in left knee: Secondary | ICD-10-CM

## 2022-05-21 DIAGNOSIS — M25561 Pain in right knee: Secondary | ICD-10-CM

## 2022-05-21 DIAGNOSIS — M79641 Pain in right hand: Secondary | ICD-10-CM

## 2022-05-21 DIAGNOSIS — M79671 Pain in right foot: Secondary | ICD-10-CM

## 2022-05-21 DIAGNOSIS — Z8679 Personal history of other diseases of the circulatory system: Secondary | ICD-10-CM

## 2022-05-21 DIAGNOSIS — M79642 Pain in left hand: Secondary | ICD-10-CM | POA: Diagnosis not present

## 2022-05-21 DIAGNOSIS — L700 Acne vulgaris: Secondary | ICD-10-CM

## 2022-05-21 DIAGNOSIS — G8929 Other chronic pain: Secondary | ICD-10-CM | POA: Diagnosis not present

## 2022-05-21 DIAGNOSIS — G629 Polyneuropathy, unspecified: Secondary | ICD-10-CM

## 2022-05-21 DIAGNOSIS — L732 Hidradenitis suppurativa: Secondary | ICD-10-CM

## 2022-05-21 DIAGNOSIS — Z862 Personal history of diseases of the blood and blood-forming organs and certain disorders involving the immune mechanism: Secondary | ICD-10-CM

## 2022-05-23 LAB — CBC WITH DIFFERENTIAL/PLATELET
Absolute Monocytes: 494 cells/uL (ref 200–950)
Eosinophils Absolute: 30 cells/uL (ref 15–500)
Eosinophils Relative: 0.4 %
MCH: 27.4 pg (ref 27.0–33.0)
MCV: 83.7 fL (ref 80.0–100.0)
Neutro Abs: 5320 cells/uL (ref 1500–7800)
Platelets: 404 10*3/uL — ABNORMAL HIGH (ref 140–400)
RBC: 4.24 10*6/uL (ref 3.80–5.10)
RDW: 13.5 % (ref 11.0–15.0)

## 2022-05-23 LAB — COMPLETE METABOLIC PANEL WITH GFR
AG Ratio: 1.4 (calc) (ref 1.0–2.5)
ALT: 9 U/L (ref 6–29)
AST: 18 U/L (ref 10–30)
Albumin: 4.1 g/dL (ref 3.6–5.1)
Alkaline phosphatase (APISO): 151 U/L — ABNORMAL HIGH (ref 31–125)
CO2: 26 mmol/L (ref 20–32)
Chloride: 105 mmol/L (ref 98–110)
Globulin: 3 g/dL (calc) (ref 1.9–3.7)
Glucose, Bld: 92 mg/dL (ref 65–99)
Potassium: 4.1 mmol/L (ref 3.5–5.3)
Sodium: 139 mmol/L (ref 135–146)
Total Bilirubin: 0.6 mg/dL (ref 0.2–1.2)

## 2022-05-23 LAB — ANGIOTENSIN CONVERTING ENZYME: Angiotensin-Converting Enzyme: 30 U/L (ref 9–67)

## 2022-05-23 LAB — ANTI-NUCLEAR AB-TITER (ANA TITER): ANA Titer 1: 1:40 {titer} — ABNORMAL HIGH

## 2022-05-23 LAB — PROTEIN / CREATININE RATIO, URINE
Protein/Creat Ratio: 50 mg/g creat (ref 24–184)
Protein/Creatinine Ratio: 0.05 mg/mg creat (ref 0.024–0.184)
Total Protein, Urine: 16 mg/dL (ref 5–24)

## 2022-05-23 LAB — C3 AND C4: C4 Complement: 22 mg/dL (ref 15–57)

## 2022-05-23 LAB — ANTI-SMITH ANTIBODY: ENA SM Ab Ser-aCnc: <1 AI

## 2022-05-23 LAB — GLUCOSE 6 PHOSPHATE DEHYDROGENASE: G-6PDH: 18.7 U/g Hgb (ref 7.0–20.5)

## 2022-05-23 LAB — ANTI-SCLERODERMA ANTIBODY: Scleroderma (Scl-70) (ENA) Antibody, IgG: <1 AI

## 2022-05-23 LAB — ANTI-DNA ANTIBODY, DOUBLE-STRANDED: ds DNA Ab: 1 IU/mL

## 2022-05-23 LAB — RNP ANTIBODY: Ribonucleic Protein(ENA) Antibody, IgG: <1 AI

## 2022-05-25 LAB — BETA-2 GLYCOPROTEIN ANTIBODIES
Beta-2 Glyco 1 IgA: <2 U/mL (ref <20.0)
Beta-2 Glyco 1 IgM: <2 U/mL (ref <20.0)
Beta-2 Glyco I IgG: <2 U/mL (ref <20.0)

## 2022-05-25 LAB — CBC WITH DIFFERENTIAL/PLATELET
Basophils Absolute: 61 cells/uL (ref 0–200)
Basophils Relative: 0.8 %
HCT: 35.5 % (ref 35.0–45.0)
Hemoglobin: 11.6 g/dL — ABNORMAL LOW (ref 11.7–15.5)
Lymphs Abs: 1695 cells/uL (ref 850–3900)
MCHC: 32.7 g/dL (ref 32.0–36.0)
MPV: 10.1 fL (ref 7.5–12.5)
Monocytes Relative: 6.5 %
Neutrophils Relative %: 70 %
Total Lymphocyte: 22.3 %
WBC: 7.6 10*3/uL (ref 3.8–10.8)

## 2022-05-25 LAB — SJOGRENS SYNDROME-B EXTRACTABLE NUCLEAR ANTIBODY: SSB (La) (ENA) Antibody, IgG: <1 AI

## 2022-05-25 LAB — COMPLETE METABOLIC PANEL WITH GFR
BUN: 14 mg/dL (ref 7–25)
Calcium: 9.7 mg/dL (ref 8.6–10.2)
Creat: 0.71 mg/dL (ref 0.50–0.99)
Total Protein: 7.1 g/dL (ref 6.1–8.1)
eGFR: 109 mL/min/{1.73_m2} (ref >=60)

## 2022-05-25 LAB — SJOGRENS SYNDROME-A EXTRACTABLE NUCLEAR ANTIBODY: SSA (Ro) (ENA) Antibody, IgG: <1 AI

## 2022-05-25 LAB — CARDIOLIPIN ANTIBODIES, IGG, IGM, IGA
Anticardiolipin IgA: <2 APL-U/mL (ref <20.0)
Anticardiolipin IgG: <2 GPL-U/mL (ref <20.0)
Anticardiolipin IgM: <2 MPL-U/mL (ref <20.0)

## 2022-05-25 LAB — PROTEIN / CREATININE RATIO, URINE: Creatinine, Urine: 323 mg/dL — ABNORMAL HIGH (ref 20–275)

## 2022-05-25 LAB — ANA: Anti Nuclear Antibody (ANA): POSITIVE — AB

## 2022-05-25 LAB — C3 AND C4: C3 Complement: 127 mg/dL (ref 83–193)

## 2022-05-25 LAB — ANTI-NUCLEAR AB-TITER (ANA TITER): ANA TITER: 1:40 {titer} — ABNORMAL HIGH

## 2022-05-25 NOTE — Progress Notes (Signed)
I will discuss results at the follow-up visit.

## 2022-06-01 NOTE — Progress Notes (Signed)
Office Visit Note  Patient: Nichole Robinson             Date of Birth: 1981/03/24           MRN: [id number]             PCP: Shanon Rosser, PA-C Referring: London Pepper, MD Visit Date: 06/12/2022 Occupation: @GUAROCC @  Subjective:  Pain in multiple joints  History of Present Illness: Nichole Robinson is a 41 y.o. female with history of pain in multiple joints.  She history of pain and discomfort in her bilateral hands, bilateral knee joints and her feet.  She complains of intermittent swelling.  There is no history of oral ulcers, nasal ulcers, malar rash, photosensitivity, Raynaud's phenomenon or lymphadenopathy.  She continues to suffer from hidradenitis suppurativa.  She states she could not get an appointment with the dermatology office for 1 year.  She is also noticed a rash on her bilateral hands.  Activities of Daily Living:  Patient reports morning stiffness for 0 minutes.   Patient Denies nocturnal pain.  Difficulty dressing/grooming: Denies Difficulty climbing stairs: Denies Difficulty getting out of chair: Denies Difficulty using hands for taps, buttons, cutlery, and/or writing: Denies  Review of Systems  Constitutional:  Negative for fatigue.  HENT:  Negative for mouth sores and mouth dryness.   Eyes:  Negative for dryness.  Respiratory:  Negative for shortness of breath.   Cardiovascular:  Negative for chest pain and palpitations.  Gastrointestinal:  Negative for blood in stool, constipation and diarrhea.  Endocrine: Negative for increased urination.  Genitourinary:  Negative for involuntary urination.  Musculoskeletal:  Negative for joint pain, gait problem, joint pain, joint swelling, myalgias, muscle weakness, morning stiffness, muscle tenderness and myalgias.  Skin:  Positive for rash. Negative for color change, hair loss and sensitivity to sunlight.  Allergic/Immunologic: Negative for susceptible to infections.  Neurological:  Negative for dizziness and  headaches.  Hematological:  Negative for swollen glands.  Psychiatric/Behavioral:  Negative for depressed mood and sleep disturbance. The patient is not nervous/anxious.     PMFS History:  Patient Active Problem List   Diagnosis Date Noted   Primary osteoarthritis of both hands 06/12/2022   Primary osteoarthritis of both feet 06/12/2022   Hidradenitis suppurativa 05/21/2022   History of anemia 05/21/2022   History of hypertension 05/21/2022   Neuropathy 05/21/2022    Past Medical History:  Diagnosis Date   Hidradenitis suppurativa    History of anemia    History of cystic acne     Family History  Problem Relation Age of Onset   Healthy Mother    Healthy Father    Healthy Brother    Healthy Daughter    Past Surgical History:  Procedure Laterality Date   CESAREAN SECTION     Social History   Social History Narrative   Not on file    There is no immunization history on file for this patient.   Objective: Vital Signs: BP (!) 156/106 (BP Location: Left Arm, Patient Position: Sitting, Cuff Size: Large)   Pulse 76   Resp 17   Ht 5\' 2"  (1.575 m)   Wt 205 lb 6.4 oz (93.2 kg)   BMI 37.57 kg/m    Physical Exam Vitals and nursing note reviewed.  Constitutional:      Appearance: She is well-developed.  HENT:     Head: Normocephalic and atraumatic.  Eyes:     Conjunctiva/sclera: Conjunctivae normal.  Cardiovascular:     Rate and  Rhythm: Normal rate and regular rhythm.     Heart sounds: Normal heart sounds.  Pulmonary:     Effort: Pulmonary effort is normal.     Breath sounds: Normal breath sounds.  Abdominal:     General: Bowel sounds are normal.     Palpations: Abdomen is soft.  Musculoskeletal:     Cervical back: Normal range of motion.  Lymphadenopathy:     Cervical: No cervical adenopathy.  Skin:    General: Skin is warm and dry.     Capillary Refill: Capillary refill takes less than 2 seconds.     Comments: Dry scales were noted on the dorsum of her  bilateral hands.  Hidradenitis suppurativa was noted in the both axillary regions.  Neurological:     Mental Status: She is alert and oriented to person, place, and time.  Psychiatric:        Behavior: Behavior normal.      Musculoskeletal Exam: Cervical, thoracic and lumbar spine with good range of motion.  Shoulder joints, elbow joints, wrist joints, MCPs PIPs and DIPs been good range of motion with no synovitis.  Hip joints, knee joints with good range of motion.  No warmth swelling or effusion was noted.  There was no tenderness over ankles or MTPs.  CDAI Exam: CDAI Score: -- Patient Global: --; Provider Global: -- Swollen: --; Tender: -- Joint Exam 06/12/2022   No joint exam has been documented for this visit   There is currently no information documented on the homunculus. Go to the Rheumatology activity and complete the homunculus joint exam.  Investigation: No additional findings.  Imaging: XR Foot 2 Views Left  Result Date: 05/21/2022 PIP and DIP narrowing was noted.  No MTP, intertarsal, tibiotalar or subtalar joint space narrowing was noted.   No erosive changes were noted. Impression: These findings are consistent with degenerative changes of the foot.  XR Foot 2 Views Right  Result Date: 05/21/2022 PIP and DIP narrowing was noted.  No MTP, intertarsal, tibiotalar or subtalar joint space narrowing was noted.  Posterior calcaneal spur was noted.  No erosive changes were noted. Impression: These findings are consistent with degenerative changes of the foot.  XR KNEE 3 VIEW LEFT  Result Date: 05/21/2022 No medial or lateral compartment narrowing was noted.  No patellofemoral narrowing was noted. Impression: Unremarkable x-rays of the knee joint.  XR KNEE 3 VIEW RIGHT  Result Date: 05/21/2022 No medial or lateral compartment narrowing was noted.  No patellofemoral narrowing was noted. Impression: Unremarkable x-rays of the knee joint.  XR Hand 2 View Left  Result  Date: 05/21/2022 Mild PIP narrowing was noted.  No MCP, DIP, intercarpal or radiocarpal joint space narrowing was noted.  No erosive changes were noted. Impression: These findings are consistent with early degenerative changes of the hand.  XR Hand 2 View Right  Result Date: 05/21/2022 Mild PIP narrowing was noted.  No MCP, DIP, intercarpal or radiocarpal joint space narrowing was noted.  No erosive changes were noted. Impression: These findings are consistent with early degenerative changes of the hand.   Recent Labs: Lab Results  Component Value Date   WBC 7.6 05/21/2022   HGB 11.6 (L) 05/21/2022   PLT 404 (H) 05/21/2022   NA 139 05/21/2022   K 4.1 05/21/2022   CL 105 05/21/2022   CO2 26 05/21/2022   GLUCOSE 92 05/21/2022   BUN 14 05/21/2022   CREATININE 0.71 05/21/2022   BILITOT 0.6 05/21/2022   AST 18 05/21/2022  ALT 9 05/21/2022   PROT 7.1 05/21/2022   CALCIUM 9.7 05/21/2022      10/23/21: ANA 1:80 NH, ESR 11, CRP 14.8, RF<14, anti-CCP<16   Speciality Comments: No specialty comments available.  Procedures:  No procedures performed Allergies: Patient has no known allergies.   Assessment / Plan:     Visit Diagnoses: Polyarthralgia - History of intermittent joint pain and swelling over 10 years.  History of swelling in her elbows, hands, knees, ankles and feet.  No synovitis was noted.  All autoimmune work-up was negative.  I advised patient to return if she develops any new symptoms or joint swelling.  Primary osteoarthritis of both hands - History of intermittent swelling.  No synovitis noted.  Clinical and radiographic findings are consistent with early degenerative changes.  X-ray findings were reviewed with the patient.  A handout on hand exercises was given.  Joint protection and muscle strengthening was discussed.  Chronic pain of both knees - History of intermittent pain and swelling.  No swelling was noted.  X-rays were unremarkable.  X-ray findings were  discussed with the patient.  Lower extremity muscle strengthening exercises all autoimmune work-up negative.  Primary osteoarthritis of both feet - History of intermittent pain and swelling.  X-rays showed early degenerative changes.  X-rays findings were discussed with the patient.  Proper fitting shoes were advised.  Positive ANA (antinuclear antibody) - ANA low titer positive, ENA negative, complements normal.  May 21, 2022 ANA 1: 40 NH, cytoplasmic, ENA negative, C3-C4 normal, beta-2 GP 1 negative, anticardiolipin negative, urine protein creatinine ratio normal, G6PD normal, ACE normal.  Lab results were discussed with the patient.  She was advised to contact me if she develops any new symptoms.  She denies any history of oral ulcers, nasal ulcers, malar rash, photosensitivity, Raynaud's phenomenon or lymphadenopathy.  Rash-she had dry scales over the dorsal aspects of her bilateral hands consistent with atopic dermatitis.  Advised to use topical cortisone.  She will be also seeing dermatologist.  Hidradenitis suppurativa - Recurrent problem for the last 5 years per patient.  Patient was referred to dermatology.  Patient could not get an appointment for further 6 to 7 months.  We will send another dermatology referral.  Cystic acne - Scarring acne.  Patient was referred to dermatology.  History of hypertension - She recently started amlodipine 2.5 mg p.o. daily.  Her blood pressure was again elevated.  She may need higher dose of amlodipine.  I advised her to discuss this further with her PCP.  Neuropathy - Patient reports numbness in her fingertips when her hands are cold.  History of anemia  Orders: No orders of the defined types were placed in this encounter.  No orders of the defined types were placed in this encounter.    Follow-Up Instructions: Return in about 1 year (around 06/13/2023) for Osteoarthritis.   Bo Merino, MD  Note - This record has been created using  Editor, commissioning.  Chart creation errors have been sought, but may not always  have been located. Such creation errors do not reflect on  the standard of medical care.

## 2022-06-12 ENCOUNTER — Ambulatory Visit: Payer: BC Managed Care – PPO | Attending: Rheumatology | Admitting: Rheumatology

## 2022-06-12 ENCOUNTER — Encounter: Payer: Self-pay | Admitting: Rheumatology

## 2022-06-12 VITALS — BP 156/106 | HR 76 | Resp 17 | Ht 62.0 in | Wt 205.4 lb

## 2022-06-12 DIAGNOSIS — Z862 Personal history of diseases of the blood and blood-forming organs and certain disorders involving the immune mechanism: Secondary | ICD-10-CM

## 2022-06-12 DIAGNOSIS — M19071 Primary osteoarthritis, right ankle and foot: Secondary | ICD-10-CM

## 2022-06-12 DIAGNOSIS — L732 Hidradenitis suppurativa: Secondary | ICD-10-CM

## 2022-06-12 DIAGNOSIS — M19041 Primary osteoarthritis, right hand: Secondary | ICD-10-CM | POA: Diagnosis not present

## 2022-06-12 DIAGNOSIS — Z8679 Personal history of other diseases of the circulatory system: Secondary | ICD-10-CM

## 2022-06-12 DIAGNOSIS — M19072 Primary osteoarthritis, left ankle and foot: Secondary | ICD-10-CM

## 2022-06-12 DIAGNOSIS — M19042 Primary osteoarthritis, left hand: Secondary | ICD-10-CM

## 2022-06-12 DIAGNOSIS — M2550 Pain in unspecified joint: Secondary | ICD-10-CM

## 2022-06-12 DIAGNOSIS — G629 Polyneuropathy, unspecified: Secondary | ICD-10-CM

## 2022-06-12 DIAGNOSIS — M25561 Pain in right knee: Secondary | ICD-10-CM

## 2022-06-12 DIAGNOSIS — R768 Other specified abnormal immunological findings in serum: Secondary | ICD-10-CM

## 2022-06-12 DIAGNOSIS — M25562 Pain in left knee: Secondary | ICD-10-CM

## 2022-06-12 DIAGNOSIS — G8929 Other chronic pain: Secondary | ICD-10-CM

## 2022-06-12 DIAGNOSIS — R21 Rash and other nonspecific skin eruption: Secondary | ICD-10-CM

## 2022-06-12 DIAGNOSIS — L700 Acne vulgaris: Secondary | ICD-10-CM

## 2022-06-12 NOTE — Addendum Note (Signed)
Addended by: Francis Gaines C on: 06/12/2022 11:48 AM   Modules accepted: Orders

## 2022-06-12 NOTE — Patient Instructions (Signed)
Osteoarthritis  Osteoarthritis is a type of arthritis. It refers to joint pain or joint disease. Osteoarthritis affects tissue that covers the ends of bones in joints (cartilage). Cartilage acts as a cushion between the bones and helps them move smoothly. Osteoarthritis occurs when cartilage in the joints gets worn down. Osteoarthritis is sometimes called "wear and tear" arthritis. Osteoarthritis is the most common form of arthritis. It often occurs in older people. It is a condition that gets worse over time. The joints most often affected by this condition are in the fingers, toes, hips, knees, and spine, including the neck and lower back. What are the causes? This condition is caused by the wearing down of cartilage that covers the ends of bones. What increases the risk? The following factors may make you more likely to develop this condition: Being age 50 or older. Obesity. Overuse of joints. Past injury of a joint. Past surgery on a joint. Family history of osteoarthritis. What are the signs or symptoms? The main symptoms of this condition are pain, swelling, and stiffness in the joint. Other symptoms may include: An enlarged joint. More pain and further damage caused by small pieces of bone or cartilage that break off and float inside of the joint. Small deposits of bone (osteophytes) that grow on the edges of the joint. A grating or scraping feeling inside the joint when you move it. Popping or creaking sounds when you move. Difficulty walking or exercising. An inability to grip items, twist your hand(s), or control the movements of your hands and fingers. How is this diagnosed? This condition may be diagnosed based on: Your medical history. A physical exam. Your symptoms. X-rays of the affected joint(s). Blood tests to rule out other types of arthritis. How is this treated? There is no cure for this condition, but treatment can help control pain and improve joint function.  Treatment may include a combination of therapies, such as: Pain relief techniques, such as: Applying heat and cold to the joint. Massage. A form of talk therapy called cognitive behavioral therapy (CBT). This therapy helps you set goals and follow up on the changes that you make. Medicines for pain and inflammation. The medicines can be taken by mouth or applied to the skin. They include: NSAIDs, such as ibuprofen. Prescription medicines. Strong anti-inflammatory medicines (corticosteroids). Certain nutritional supplements. A prescribed exercise program. You may work with a physical therapist. Assistive devices, such as a brace, wrap, splint, specialized glove, or cane. A weight control plan. Surgery, such as: An osteotomy. This is done to reposition the bones and relieve pain or to remove loose pieces of bone and cartilage. Joint replacement surgery. You may need this surgery if you have advanced osteoarthritis. Follow these instructions at home: Activity Rest your affected joints as told by your health care provider. Exercise as told by your health care provider. He or she may recommend specific types of exercise, such as: Strengthening exercises. These are done to strengthen the muscles that support joints affected by arthritis. Aerobic activities. These are exercises, such as brisk walking or water aerobics, that increase your heart rate. Range-of-motion activities. These help your joints move more easily. Balance and agility exercises. Managing pain, stiffness, and swelling     If directed, apply heat to the affected area as often as told by your health care provider. Use the heat source that your health care provider recommends, such as a moist heat pack or a heating pad. If you have a removable assistive device, remove it   as told by your health care provider. Place a towel between your skin and the heat source. If your health care provider tells you to keep the assistive device  on while you apply heat, place a towel between the assistive device and the heat source. Leave the heat on for 20-30 minutes. Remove the heat if your skin turns bright red. This is especially important if you are unable to feel pain, heat, or cold. You may have a greater risk of getting burned. If directed, put ice on the affected area. To do this: If you have a removable assistive device, remove it as told by your health care provider. Put ice in a plastic bag. Place a towel between your skin and the bag. If your health care provider tells you to keep the assistive device on during icing, place a towel between the assistive device and the bag. Leave the ice on for 20 minutes, 2-3 times a day. Move your fingers or toes often to reduce stiffness and swelling. Raise (elevate) the injured area above the level of your heart while you are sitting or lying down. General instructions Take over-the-counter and prescription medicines only as told by your health care provider. Maintain a healthy weight. Follow instructions from your health care provider for weight control. Do not use any products that contain nicotine or tobacco, such as cigarettes, e-cigarettes, and chewing tobacco. If you need help quitting, ask your health care provider. Use assistive devices as told by your health care provider. Keep all follow-up visits as told by your health care provider. This is important. Where to find more information [employer] of Arthritis and Musculoskeletal and Skin Diseases: www.niams.http://www.myers.net/ [employer] on Aging: https://walker.com/ American College of Rheumatology: www.rheumatology.org Contact a health care provider if: You have redness, swelling, or a feeling of warmth in a joint that gets worse. You have a fever along with joint or muscle aches. You develop a rash. You have trouble doing your normal activities. Get help right away if: You have pain that gets worse and is not relieved by  pain medicine. Summary Osteoarthritis is a type of arthritis that affects tissue covering the ends of bones in joints (cartilage). This condition is caused by the wearing down of cartilage that covers the ends of bones. The main symptom of this condition is pain, swelling, and stiffness in the joint. There is no cure for this condition, but treatment can help control pain and improve joint function. This information is not intended to replace advice given to you by your health care provider. Make sure you discuss any questions you have with your health care provider. Document Revised: 01/28/2022 Document Reviewed: 07/25/2019 Elsevier Patient Education  2023 [employer] Exercises for Chronic Knee Pain Chronic knee pain is pain that lasts longer than 3 months. For most people with chronic knee pain, exercise and weight loss is an important part of treatment. Your health care provider may want you to focus on: Strengthening the muscles that support your knee. This can take pressure off your knee and lessen pain. Preventing knee stiffness. Maintaining or increasing how far you can move your knee. Losing weight (if this applies) to take pressure off your knee, decrease your risk for injury, and make it easier for you to exercise. Your health care provider will help you develop an exercise program that matches your needs and physical abilities. Below are simple, low-impact exercises you can do at home. Ask your health care provider or a physical therapist how  often you should do your exercise program and how many times to repeat each exercise. General safety tips Follow these safety tips for exercising with chronic knee pain: Get your health care provider's approval before doing any exercises. Start slowly and stop any time an exercise causes pain. Do not exercise if your knee pain is flaring up. Warm up first. Stretching a cold muscle can cause an injury. Do 5-10 minutes of easy movement or light  stretching before beginning your exercise routine. Do 5-10 minutes of low-impact activity (like walking or cycling) before starting strengthening exercises. Contact your health care provider any time you have pain during or after exercising. Exercise may cause discomfort but should not be painful. It is normal to be a little stiff or sore after exercising.  Stretching and range-of-motion exercises Front thigh stretch  Stand up straight and support your body by holding on to a chair or resting one hand on a wall. With your legs straight and close together, bend one knee to lift your heel up toward your buttocks. Using one hand for support, grab your ankle with your free hand. Pull your foot up closer toward your buttocks to feel the stretch in front of your thigh. Hold the stretch for 30 seconds. Repeat __________ times. Complete this exercise __________ times a day. Back thigh stretch  Sit on the floor with your back straight and your legs out straight in front of you. Place the palms of your hands on the floor and slide them toward your feet as you bend at the hip. Try to touch your nose to your knees and feel the stretch in the back of your thighs. Hold for 30 seconds. Repeat __________ times. Complete this exercise __________ times a day. Calf stretch  Stand facing a wall. Place the palms of your hands flat against the wall, arms extended, and lean slightly against the wall. Get into a lunge position with one leg bent at the knee and the other leg stretched out straight behind you. Keep both feet facing the wall and increase the bend in your knee while keeping the heel of the other leg flat on the ground. You should feel the stretch in your calf. Hold for 30 seconds. Repeat __________ times. Complete this exercise __________ times a day. Strengthening exercises Straight leg lift Lie on your back with one knee bent and the other leg out straight. Slowly lift the straight leg without  bending the knee. Lift until your foot is about 12 inches (30 cm) off the floor. Hold for 3-5 seconds and slowly lower your leg. Repeat __________ times. Complete this exercise __________ times a day. Single leg dip Stand between two chairs and put both hands on the backs of the chairs for support. Extend one leg out straight with your body weight resting on the heel of the standing leg. Slowly bend your standing knee to dip your body to the level that is comfortable for you. Hold for 3-5 seconds. Repeat __________ times. Complete this exercise __________ times a day. Hamstring curls Stand straight, knees close together, facing the back of a chair. Hold on to the back of a chair with both hands. Keep one leg straight. Bend the other knee while bringing the heel up toward the buttock until the knee is bent at a 90-degree angle (right angle). Hold for 3-5 seconds. Repeat __________ times. Complete this exercise __________ times a day. Wall squat Stand straight with your back, hips, and head against a wall. Step forward  one foot at a time with your back still against the wall. Your feet should be 2 feet (61 cm) from the wall at shoulder width. Keeping your back, hips, and head against the wall, slide down the wall to as close of a sitting position as you can get. Hold for 5-10 seconds, then slowly slide back up. Repeat __________ times. Complete this exercise __________ times a day. Step-ups Step up with one foot onto a sturdy platform or stool that is about 6 inches (15 cm) high. Face sideways with one foot on the platform and one on the ground. Place all your weight on the platform foot and lift your body off the ground until your knee extends. Let your other leg hang free to the side. Hold for 3-5 seconds then slowly lower your weight down to the floor foot. Repeat __________ times. Complete this exercise __________ times a day. Contact a health care provider if: Your exercise causes  pain. Your pain is worse after you exercise. Your pain prevents you from doing your exercises. This information is not intended to replace advice given to you by your health care provider. Make sure you discuss any questions you have with your health care provider. Document Revised: 12/01/2019 Document Reviewed: 07/25/2019 Elsevier Patient Education  2023 [employer] Hand Exercises Hand exercises can be helpful for almost anyone. These exercises can strengthen the hands, improve flexibility and movement, and increase blood flow to the hands. These results can make work and daily tasks easier. Hand exercises can be especially helpful for people who have joint pain from arthritis or have nerve damage from overuse (carpal tunnel syndrome). These exercises can also help people who have injured a hand. Exercises Most of these hand exercises are gentle stretching and motion exercises. It is usually safe to do them often throughout the day. Warming up your hands before exercise may help to reduce stiffness. You can do this with gentle massage or by placing your hands in warm water for 10-15 minutes. It is normal to feel some stretching, pulling, tightness, or mild discomfort as you begin new exercises. This will gradually improve. Stop an exercise right away if you feel sudden, severe pain or your pain gets worse. Ask your health care provider which exercises are best for you. Knuckle bend or "claw" fist  Stand or sit with your arm, hand, and all five fingers pointed straight up. Make sure to keep your wrist straight during the exercise. Gently bend your fingers down toward your palm until the tips of your fingers are touching the top of your palm. Keep your big knuckle straight and just bend the small knuckles in your fingers. Hold this position for __________ seconds. Straighten (extend) your fingers back to the starting position. Repeat this exercise 5-10 times with each hand. Full finger  fist  Stand or sit with your arm, hand, and all five fingers pointed straight up. Make sure to keep your wrist straight during the exercise. Gently bend your fingers into your palm until the tips of your fingers are touching the middle of your palm. Hold this position for __________ seconds. Extend your fingers back to the starting position, stretching every joint fully. Repeat this exercise 5-10 times with each hand. Straight fist Stand or sit with your arm, hand, and all five fingers pointed straight up. Make sure to keep your wrist straight during the exercise. Gently bend your fingers at the big knuckle, where your fingers meet your hand, and the middle knuckle. Keep  the knuckle at the tips of your fingers straight and try to touch the bottom of your palm. Hold this position for __________ seconds. Extend your fingers back to the starting position, stretching every joint fully. Repeat this exercise 5-10 times with each hand. Tabletop  Stand or sit with your arm, hand, and all five fingers pointed straight up. Make sure to keep your wrist straight during the exercise. Gently bend your fingers at the big knuckle, where your fingers meet your hand, as far down as you can while keeping the small knuckles in your fingers straight. Think of forming a tabletop with your fingers. Hold this position for __________ seconds. Extend your fingers back to the starting position, stretching every joint fully. Repeat this exercise 5-10 times with each hand. Finger spread  Place your hand flat on a table with your palm facing down. Make sure your wrist stays straight as you do this exercise. Spread your fingers and thumb apart from each other as far as you can until you feel a gentle stretch. Hold this position for __________ seconds. Bring your fingers and thumb tight together again. Hold this position for __________ seconds. Repeat this exercise 5-10 times with each hand. Making circles  Stand or sit  with your arm, hand, and all five fingers pointed straight up. Make sure to keep your wrist straight during the exercise. Make a circle by touching the tip of your thumb to the tip of your index finger. Hold for __________ seconds. Then open your hand wide. Repeat this motion with your thumb and each finger on your hand. Repeat this exercise 5-10 times with each hand. Thumb motion  Sit with your forearm resting on a table and your wrist straight. Your thumb should be facing up toward the ceiling. Keep your fingers relaxed as you move your thumb. Lift your thumb up as high as you can toward the ceiling. Hold for __________ seconds. Bend your thumb across your palm as far as you can, reaching the tip of your thumb for the small finger (pinkie) side of your palm. Hold for __________ seconds. Repeat this exercise 5-10 times with each hand. Grip strengthening  Hold a stress ball or other soft ball in the middle of your hand. Slowly increase the pressure, squeezing the ball as much as you can without causing pain. Think of bringing the tips of your fingers into the middle of your palm. All of your finger joints should bend when doing this exercise. Hold your squeeze for __________ seconds, then relax. Repeat this exercise 5-10 times with each hand. Contact a health care provider if: Your hand pain or discomfort gets much worse when you do an exercise. Your hand pain or discomfort does not improve within 2 hours after you exercise. If you have any of these problems, stop doing these exercises right away. Do not do them again unless your health care provider says that you can. Get help right away if: You develop sudden, severe hand pain or swelling. If this happens, stop doing these exercises right away. Do not do them again unless your health care provider says that you can. This information is not intended to replace advice given to you by your health care provider. Make sure you discuss any  questions you have with your health care provider. Document Revised: 11/15/2020 Document Reviewed: 11/15/2020 Elsevier Patient Education  2023 [employer].

## 2022-11-10 ENCOUNTER — Other Ambulatory Visit: Payer: Self-pay | Admitting: Obstetrics and Gynecology

## 2022-11-10 DIAGNOSIS — R928 Other abnormal and inconclusive findings on diagnostic imaging of breast: Secondary | ICD-10-CM

## 2022-11-21 ENCOUNTER — Ambulatory Visit
Admission: RE | Admit: 2022-11-21 | Discharge: 2022-11-21 | Disposition: A | Discharge status: Home / Self Care | Payer: BC Managed Care – PPO | Source: Ambulatory Visit | Attending: Obstetrics and Gynecology | Admitting: Obstetrics and Gynecology

## 2022-11-21 DIAGNOSIS — R928 Other abnormal and inconclusive findings on diagnostic imaging of breast: Secondary | ICD-10-CM

## 2023-05-29 NOTE — Progress Notes (Deleted)
   Office Visit Note  Patient: Nichole Robinson             Date of Birth: [birth year]           MRN: [id number]             PCP: Lindaann Pascal, PA-C Referring: Lindaann Pascal, PA-C Visit Date: 06/12/2023 Occupation: @GUAROCC @  Subjective:  No chief complaint on file.   History of Present Illness: Nichole Robinson is a 42 y.o. female ***     Activities of Daily Living:  Patient reports morning stiffness for *** {minute/hour:19697}.   Patient {ACTIONS;DENIES/REPORTS:21021675::"Denies"} nocturnal pain.  Difficulty dressing/grooming: {ACTIONS;DENIES/REPORTS:21021675::"Denies"} Difficulty climbing stairs: {ACTIONS;DENIES/REPORTS:21021675::"Denies"} Difficulty getting out of chair: {ACTIONS;DENIES/REPORTS:21021675::"Denies"} Difficulty using hands for taps, buttons, cutlery, and/or writing: {ACTIONS;DENIES/REPORTS:21021675::"Denies"}  No Rheumatology ROS completed.   PMFS History:  Patient Active Problem List   Diagnosis Date Noted   Primary osteoarthritis of both hands 06/12/2022   Primary osteoarthritis of both feet 06/12/2022   Hidradenitis suppurativa 05/21/2022   History of anemia 05/21/2022   History of hypertension 05/21/2022   Neuropathy 05/21/2022    Past Medical History:  Diagnosis Date   Hidradenitis suppurativa    History of anemia    History of cystic acne     Family History  Problem Relation Age of Onset   Healthy Mother    Healthy Father    Healthy Brother    Healthy Daughter    Past Surgical History:  Procedure Laterality Date   CESAREAN SECTION     Social History   Social History Narrative   Not on file    There is no immunization history on file for this patient.   Objective: Vital Signs: There were no vitals taken for this visit.   Physical Exam   Musculoskeletal Exam: ***  CDAI Exam: CDAI Score: -- Patient Global: --; Provider Global: -- Swollen: --; Tender: -- Joint Exam 06/12/2023   No joint exam has been documented for this visit    There is currently no information documented on the homunculus. Go to the Rheumatology activity and complete the homunculus joint exam.  Investigation: No additional findings.  Imaging: No results found.  Recent Labs: Lab Results  Component Value Date   WBC 7.6 05/21/2022   HGB 11.6 (L) 05/21/2022   PLT 404 (H) 05/21/2022   NA 139 05/21/2022   K 4.1 05/21/2022   CL 105 05/21/2022   CO2 26 05/21/2022   GLUCOSE 92 05/21/2022   BUN 14 05/21/2022   CREATININE 0.71 05/21/2022   BILITOT 0.6 05/21/2022   AST 18 05/21/2022   ALT 9 05/21/2022   PROT 7.1 05/21/2022   CALCIUM 9.7 05/21/2022    Speciality Comments: No specialty comments available.  Procedures:  No procedures performed Allergies: Patient has no known allergies.   Assessment / Plan:     Visit Diagnoses: No diagnosis found.  Orders: No orders of the defined types were placed in this encounter.  No orders of the defined types were placed in this encounter.   Face-to-face time spent with patient was *** minutes. Greater than 50% of time was spent in counseling and coordination of care.  Follow-Up Instructions: No follow-ups on file.   Ellen Henri, CMA  Note - This record has been created using Animal nutritionist.  Chart creation errors have been sought, but may not always  have been located. Such creation errors do not reflect on  the standard of medical care.

## 2023-06-12 ENCOUNTER — Ambulatory Visit: Payer: BC Managed Care – PPO | Admitting: Rheumatology

## 2023-06-12 DIAGNOSIS — M2550 Pain in unspecified joint: Secondary | ICD-10-CM

## 2023-06-12 DIAGNOSIS — R768 Other specified abnormal immunological findings in serum: Secondary | ICD-10-CM

## 2023-06-12 DIAGNOSIS — Z8679 Personal history of other diseases of the circulatory system: Secondary | ICD-10-CM

## 2023-06-12 DIAGNOSIS — R21 Rash and other nonspecific skin eruption: Secondary | ICD-10-CM

## 2023-06-12 DIAGNOSIS — M19071 Primary osteoarthritis, right ankle and foot: Secondary | ICD-10-CM

## 2023-06-12 DIAGNOSIS — G629 Polyneuropathy, unspecified: Secondary | ICD-10-CM

## 2023-06-12 DIAGNOSIS — G8929 Other chronic pain: Secondary | ICD-10-CM

## 2023-06-12 DIAGNOSIS — L732 Hidradenitis suppurativa: Secondary | ICD-10-CM

## 2023-06-12 DIAGNOSIS — Z862 Personal history of diseases of the blood and blood-forming organs and certain disorders involving the immune mechanism: Secondary | ICD-10-CM

## 2023-06-12 DIAGNOSIS — M19041 Primary osteoarthritis, right hand: Secondary | ICD-10-CM

## 2023-06-12 DIAGNOSIS — L700 Acne vulgaris: Secondary | ICD-10-CM

## 2023-07-07 NOTE — Progress Notes (Unsigned)
   Office Visit Note  Patient: Nichole Robinson             Date of Birth: 1981-03-31           MRN: [id number]             PCP: Lindaann Pascal, PA-C Referring: Lindaann Pascal, PA-C Visit Date: 07/16/2023 Occupation: @GUAROCC @  Subjective:  No chief complaint on file.   History of Present Illness: Nichole Robinson is a 42 y.o. female ***     Activities of Daily Living:  Patient reports morning stiffness for *** {minute/hour:19697}.   Patient {ACTIONS;DENIES/REPORTS:21021675::"Denies"} nocturnal pain.  Difficulty dressing/grooming: {ACTIONS;DENIES/REPORTS:21021675::"Denies"} Difficulty climbing stairs: {ACTIONS;DENIES/REPORTS:21021675::"Denies"} Difficulty getting out of chair: {ACTIONS;DENIES/REPORTS:21021675::"Denies"} Difficulty using hands for taps, buttons, cutlery, and/or writing: {ACTIONS;DENIES/REPORTS:21021675::"Denies"}  No Rheumatology ROS completed.   PMFS History:  Patient Active Problem List   Diagnosis Date Noted   Primary osteoarthritis of both hands 06/12/2022   Primary osteoarthritis of both feet 06/12/2022   Hidradenitis suppurativa 05/21/2022   History of anemia 05/21/2022   History of hypertension 05/21/2022   Neuropathy 05/21/2022    Past Medical History:  Diagnosis Date   Hidradenitis suppurativa    History of anemia    History of cystic acne     Family History  Problem Relation Age of Onset   Healthy Mother    Healthy Father    Healthy Brother    Healthy Daughter    Past Surgical History:  Procedure Laterality Date   CESAREAN SECTION     Social History   Social History Narrative   Not on file    There is no immunization history on file for this patient.   Objective: Vital Signs: There were no vitals taken for this visit.   Physical Exam   Musculoskeletal Exam: ***  CDAI Exam: CDAI Score: -- Patient Global: --; Provider Global: -- Swollen: --; Tender: -- Joint Exam 07/16/2023   No joint exam has been documented for this visit    There is currently no information documented on the homunculus. Go to the Rheumatology activity and complete the homunculus joint exam.  Investigation: No additional findings.  Imaging: No results found.  Recent Labs: Lab Results  Component Value Date   WBC 7.6 05/21/2022   HGB 11.6 (L) 05/21/2022   PLT 404 (H) 05/21/2022   NA 139 05/21/2022   K 4.1 05/21/2022   CL 105 05/21/2022   CO2 26 05/21/2022   GLUCOSE 92 05/21/2022   BUN 14 05/21/2022   CREATININE 0.71 05/21/2022   BILITOT 0.6 05/21/2022   AST 18 05/21/2022   ALT 9 05/21/2022   PROT 7.1 05/21/2022   CALCIUM 9.7 05/21/2022    Speciality Comments: No specialty comments available.  Procedures:  No procedures performed Allergies: Patient has no known allergies.   Assessment / Plan:     Visit Diagnoses: No diagnosis found.  Orders: No orders of the defined types were placed in this encounter.  No orders of the defined types were placed in this encounter.   Face-to-face time spent with patient was *** minutes. Greater than 50% of time was spent in counseling and coordination of care.  Follow-Up Instructions: No follow-ups on file.   Ellen Henri, CMA  Note - This record has been created using Animal nutritionist.  Chart creation errors have been sought, but may not always  have been located. Such creation errors do not reflect on  the standard of medical care.

## 2023-07-16 ENCOUNTER — Ambulatory Visit: Payer: BC Managed Care – PPO | Admitting: Rheumatology

## 2023-07-16 DIAGNOSIS — M19041 Primary osteoarthritis, right hand: Secondary | ICD-10-CM

## 2023-07-16 DIAGNOSIS — M19071 Primary osteoarthritis, right ankle and foot: Secondary | ICD-10-CM

## 2023-07-16 DIAGNOSIS — L732 Hidradenitis suppurativa: Secondary | ICD-10-CM

## 2023-07-16 DIAGNOSIS — Z862 Personal history of diseases of the blood and blood-forming organs and certain disorders involving the immune mechanism: Secondary | ICD-10-CM

## 2023-07-16 DIAGNOSIS — R768 Other specified abnormal immunological findings in serum: Secondary | ICD-10-CM

## 2023-07-16 DIAGNOSIS — R21 Rash and other nonspecific skin eruption: Secondary | ICD-10-CM

## 2023-07-16 DIAGNOSIS — L700 Acne vulgaris: Secondary | ICD-10-CM

## 2023-07-16 DIAGNOSIS — M2550 Pain in unspecified joint: Secondary | ICD-10-CM

## 2023-07-16 DIAGNOSIS — Z8679 Personal history of other diseases of the circulatory system: Secondary | ICD-10-CM

## 2023-07-16 DIAGNOSIS — G629 Polyneuropathy, unspecified: Secondary | ICD-10-CM

## 2023-07-16 DIAGNOSIS — G8929 Other chronic pain: Secondary | ICD-10-CM

## 2023-08-27 NOTE — Progress Notes (Deleted)
 Office Visit Note  Patient: Nichole Robinson             Date of Birth: 1981/02/24           MRN: [id number]             PCP: Lindaann Pascal, PA-C Referring: Lindaann Pascal, PA-C Visit Date: 09/10/2023 Occupation: @GUAROCC @  Subjective:  No chief complaint on file.   History of Present Illness: Nichole Robinson is a 43 y.o. female ***     Activities of Daily Living:  Patient reports morning stiffness for *** {minute/hour:19697}.   Patient {ACTIONS;DENIES/REPORTS:21021675::"Denies"} nocturnal pain.  Difficulty dressing/grooming: {ACTIONS;DENIES/REPORTS:21021675::"Denies"} Difficulty climbing stairs: {ACTIONS;DENIES/REPORTS:21021675::"Denies"} Difficulty getting out of chair: {ACTIONS;DENIES/REPORTS:21021675::"Denies"} Difficulty using hands for taps, buttons, cutlery, and/or writing: {ACTIONS;DENIES/REPORTS:21021675::"Denies"}  No Rheumatology ROS completed.   PMFS History:  Patient Active Problem List   Diagnosis Date Noted   Primary osteoarthritis of both hands 06/12/2022   Primary osteoarthritis of both feet 06/12/2022   Hidradenitis suppurativa 05/21/2022   History of anemia 05/21/2022   History of hypertension 05/21/2022   Neuropathy 05/21/2022    Past Medical History:  Diagnosis Date   Hidradenitis suppurativa    History of anemia    History of cystic acne     Family History  Problem Relation Age of Onset   Healthy Mother    Healthy Father    Healthy Brother    Healthy Daughter    Past Surgical History:  Procedure Laterality Date   CESAREAN SECTION     Social History   Social History Narrative   Not on file    There is no immunization history on file for this patient.   Objective: Vital Signs: There were no vitals taken for this visit.   Physical Exam   Musculoskeletal Exam: ***  CDAI Exam: CDAI Score: -- Patient Global: --; Provider Global: -- Swollen: --; Tender: -- Joint Exam 09/10/2023   No joint exam has been documented for this visit    There is currently no information documented on the homunculus. Go to the Rheumatology activity and complete the homunculus joint exam.  Investigation: No additional findings.  Imaging: No results found.  Recent Labs: Lab Results  Component Value Date   WBC 7.6 05/21/2022   HGB 11.6 (L) 05/21/2022   PLT 404 (H) 05/21/2022   NA 139 05/21/2022   K 4.1 05/21/2022   CL 105 05/21/2022   CO2 26 05/21/2022   GLUCOSE 92 05/21/2022   BUN 14 05/21/2022   CREATININE 0.71 05/21/2022   BILITOT 0.6 05/21/2022   AST 18 05/21/2022   ALT 9 05/21/2022   PROT 7.1 05/21/2022   CALCIUM 9.7 05/21/2022    Speciality Comments: No specialty comments available.  Procedures:  No procedures performed Allergies: Patient has no known allergies.   Assessment / Plan:     Visit Diagnoses: Polyarthralgia - History of intermittent joint pain, swelling +10 years.  History of swelling in her elbows, hands, knees, ankles and feet.All autoimmune work-up was negative.  Primary osteoarthritis of both hands - Clinical and radiographic findings are consistent with early degenerative changes.  Chronic pain of both knees - X-rays were unremarkable.  Primary osteoarthritis of both feet - X-rays showed early degenerative changes.  Positive ANA (antinuclear antibody) - ANA low titer positive, ENA-, complements WNL.05/21/22 ANA 1: 40 NH, cytoplasmic, ENA-, C3-C4 normal, beta-2 GP 1-, anticardiolipin-,ACE WNL  Hidradenitis suppurativa - Referred to dermatology  Cystic acne  History of hypertension  Neuropathy  History of  anemia  Orders: No orders of the defined types were placed in this encounter.  No orders of the defined types were placed in this encounter.   Face-to-face time spent with patient was *** minutes. Greater than 50% of time was spent in counseling and coordination of care.  Follow-Up Instructions: No follow-ups on file.   Gearldine Bienenstock, PA-C  Note - This record has been created  using Dragon software.  Chart creation errors have been sought, but may not always  have been located. Such creation errors do not reflect on  the standard of medical care.

## 2023-09-10 ENCOUNTER — Ambulatory Visit: Payer: BC Managed Care – PPO | Admitting: Rheumatology

## 2023-09-10 DIAGNOSIS — M19041 Primary osteoarthritis, right hand: Secondary | ICD-10-CM

## 2023-09-10 DIAGNOSIS — G629 Polyneuropathy, unspecified: Secondary | ICD-10-CM

## 2023-09-10 DIAGNOSIS — L700 Acne vulgaris: Secondary | ICD-10-CM

## 2023-09-10 DIAGNOSIS — R768 Other specified abnormal immunological findings in serum: Secondary | ICD-10-CM

## 2023-09-10 DIAGNOSIS — Z8679 Personal history of other diseases of the circulatory system: Secondary | ICD-10-CM

## 2023-09-10 DIAGNOSIS — L732 Hidradenitis suppurativa: Secondary | ICD-10-CM

## 2023-09-10 DIAGNOSIS — G8929 Other chronic pain: Secondary | ICD-10-CM

## 2023-09-10 DIAGNOSIS — Z862 Personal history of diseases of the blood and blood-forming organs and certain disorders involving the immune mechanism: Secondary | ICD-10-CM

## 2023-09-10 DIAGNOSIS — M2550 Pain in unspecified joint: Secondary | ICD-10-CM

## 2023-09-10 DIAGNOSIS — M19071 Primary osteoarthritis, right ankle and foot: Secondary | ICD-10-CM
